# Patient Record
Sex: Female | Born: 2000 | Race: White | Hispanic: No | Marital: Single | Smoking: Never smoker
Health system: Southern US, Community
[De-identification: ages and names within clinical notes are randomized; demographics above are authoritative.]

## PROBLEM LIST (undated history)

## (undated) ENCOUNTER — Emergency Department (HOSPITAL_COMMUNITY): Payer: Medicaid Other

## (undated) DIAGNOSIS — F909 Attention-deficit hyperactivity disorder, unspecified type: Secondary | ICD-10-CM

---
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
## 2018-01-16 ENCOUNTER — Other Ambulatory Visit: Payer: Self-pay | Admitting: Neurology

## 2018-01-16 DIAGNOSIS — R569 Unspecified convulsions: Secondary | ICD-10-CM

## 2018-01-29 ENCOUNTER — Ambulatory Visit
Admission: RE | Admit: 2018-01-29 | Discharge: 2018-01-29 | Disposition: A | Discharge status: Home / Self Care | Payer: Medicaid Other | Source: Ambulatory Visit | Attending: Neurology | Admitting: Neurology

## 2018-01-29 DIAGNOSIS — R569 Unspecified convulsions: Secondary | ICD-10-CM | POA: Diagnosis not present

## 2018-01-29 MED ORDER — GADOBENATE DIMEGLUMINE 529 MG/ML IV SOLN
15.0000 mL | Freq: Once | INTRAVENOUS | Status: AC | PRN
  Administered 2018-01-29: 14 mL via INTRAVENOUS

## 2018-02-08 DIAGNOSIS — R569 Unspecified convulsions: Secondary | ICD-10-CM | POA: Insufficient documentation

## 2018-02-19 ENCOUNTER — Ambulatory Visit: Payer: Self-pay | Admitting: Family Medicine

## 2018-02-24 ENCOUNTER — Ambulatory Visit: Payer: Self-pay | Admitting: Family Medicine

## 2018-03-19 ENCOUNTER — Ambulatory Visit: Payer: Self-pay | Admitting: Family Medicine

## 2018-03-26 ENCOUNTER — Ambulatory Visit: Payer: Self-pay | Admitting: Family Medicine

## 2018-03-31 ENCOUNTER — Ambulatory Visit: Payer: Self-pay | Admitting: Family Medicine

## 2018-04-23 ENCOUNTER — Ambulatory Visit: Payer: Self-pay | Admitting: Family Medicine

## 2018-05-08 ENCOUNTER — Ambulatory Visit: Payer: Medicaid Other | Admitting: Family Medicine

## 2018-05-08 ENCOUNTER — Other Ambulatory Visit (HOSPITAL_COMMUNITY)
Admission: RE | Admit: 2018-05-08 | Discharge: 2018-05-08 | Disposition: A | Discharge status: Home / Self Care | Payer: Medicaid Other | Source: Ambulatory Visit | Attending: Family Medicine | Admitting: Family Medicine

## 2018-05-08 ENCOUNTER — Encounter: Payer: Self-pay | Admitting: Family Medicine

## 2018-05-08 VITALS — BP 112/76 | HR 87 | Temp 98.0°F | Resp 16 | Ht 66.5 in | Wt 141.0 lb

## 2018-05-08 DIAGNOSIS — R569 Unspecified convulsions: Secondary | ICD-10-CM

## 2018-05-08 DIAGNOSIS — Z113 Encounter for screening for infections with a predominantly sexual mode of transmission: Secondary | ICD-10-CM | POA: Insufficient documentation

## 2018-05-08 DIAGNOSIS — F909 Attention-deficit hyperactivity disorder, unspecified type: Secondary | ICD-10-CM | POA: Diagnosis not present

## 2018-05-08 DIAGNOSIS — Z1159 Encounter for screening for other viral diseases: Secondary | ICD-10-CM | POA: Diagnosis not present

## 2018-05-08 DIAGNOSIS — N926 Irregular menstruation, unspecified: Secondary | ICD-10-CM

## 2018-05-08 NOTE — Progress Notes (Signed)
Name: Melanie Gordon   MRN: [id number]    DOB: May 12, 2001   Date:05/08/2018       Progress Note  Subjective  Chief Complaint  Chief Complaint  Patient presents with  . Establish Care    HPI  PT presents to establish care and for the following concerns:  Seizure-like activity: She had a history of two episodes of possible seizure like activity, Saw Dr. Sherryll BurgerShah with neurology on 01/16/2018. Had MRI and EEG and all was normal - follow up PRN. She thinks this was really a panic attack and not a seizure,   Irregular Menses: Her period has been starting more frequently - Had menses 04/14/2018 and 05/07/2018 this month, and [place] she had two menses that were slightly more frequent than her typical. Menarche at age 17 - has been very regular for several years. Has not been sexually active since October 2019.  Flow is heavy on day 2, then tapers off for about 7 days total.  She notes a lot of increased stress lately - her job ([employer]) is under new ownership and things have changed dramatically, no family stress. Discussed contraceptive options to help with regularity.  She prefers OCP, but wants to wait for right now - will call if she changes her mind.   Social Services Paperwork: Needs completed because her mother is adopting a foster child - she is looking forward to this as the child has been with her family since she was 702 weeks old (about 2 years).   ADHD: Taking 54mg  Concerta and has been doing well on this dose. Denies palpitaitons, chest pain, or shortness of breath. Her focus has been good, she is at [employer] for general studies - hoping to become a Engineer, sitemedical assistant.  Task completion is okay - she is working a lot too and this distracts her from school  Patient Active Problem List   Diagnosis Date Noted  . ADHD (attention deficit hyperactivity disorder) 05/08/2018  . Irregular menses 05/08/2018  . Seizure-like activity (HCC) 02/08/2018    History reviewed. No pertinent  surgical history.  Family History  Adopted: Yes  Problem Relation Age of Onset  . Stroke Paternal Grandmother     Social History   Socioeconomic History  . Marital status: Single    Spouse name: Not on file  . Number of children: 0  . Years of education: Not on file  . Highest education level: Some college, no degree  Occupational History  . Not on file  Social Needs  . Financial resource strain: Somewhat hard  . Food insecurity:    Worry: Never true    Inability: Never true  . Transportation needs:    Medical: No    Non-medical: No  Tobacco Use  . Smoking status: Never Smoker  . Smokeless tobacco: Never Used  Substance and Sexual Activity  . Alcohol use: No  . Drug use: No  . Sexual activity: Not Currently    Partners: Male    Birth control/protection: None  Lifestyle  . Physical activity:    Days per week: 7 days    Minutes per session: 10 min  . Stress: Not at all  Relationships  . Social connections:    Talks on phone: More than three times a week    Gets together: More than three times a week    Attends religious service: More than 4 times per year    Active member of club or organization: No  Attends meetings of clubs or organizations: Never    Relationship status: Never married  . Intimate partner violence:    Fear of current or ex partner: No    Emotionally abused: No    Physically abused: No    Forced sexual activity: No  Other Topics Concern  . Not on file  Social History Narrative  . Not on file     Current Outpatient Medications:  .  ELIDEL 1 % cream, APPLY TO UPPER EYELID/BODY  BID UNTIL IMPROVED, Disp: , Rfl: 1 .  methylphenidate 54 MG PO CR tablet, Take 1 tablet by mouth every morning., Disp: , Rfl: 0  Allergies  Allergen Reactions  . Pollen Extract Other (See Comments)    I personally reviewed active problem list, medication list, allergies, family history, social history, health maintenance, lab results with the patient/caregiver  today.   ROS  Constitutional: Negative for fever or weight change.  Respiratory: Negative for cough and shortness of breath.   Cardiovascular: Negative for chest pain or palpitations.  Gastrointestinal: Negative for abdominal pain, no bowel changes.  Musculoskeletal: Negative for gait problem or joint swelling.  Skin: Negative for rash.  Neurological: Negative for dizziness or headache.  No other specific complaints in a complete review of systems (except as listed in HPI above).  Objective  Vitals:   05/08/18 1034  BP: 112/76  Pulse: 87  Resp: 16  Temp: 98 F (36.7 C)  TempSrc: Oral  SpO2: 99%  Weight: 141 lb (64 kg)  Height: 5' 6.5" (1.689 m)   Body mass index is 22.42 kg/m.  Physical Exam  Constitutional: Patient appears well-developed and well-nourished. No distress.  HENT: Head: Normocephalic and atraumatic.  Nose: Nose normal. Mouth/Throat: Oropharynx is clear and moist. Eyes: Conjunctivae and EOM are normal. No scleral icterus.   Neck: Normal range of motion. Neck supple. No JVD present. .  Cardiovascular: Normal rate, regular rhythm and normal heart sounds.  No murmur heard. No BLE edema. Pulmonary/Chest: Effort normal and breath sounds normal. No respiratory distress. Musculoskeletal: Normal range of motion, no joint effusions. No gross deformities Neurological: Pt is alert and oriented to person, place, and time. No cranial nerve deficit. Coordination, balance, strength, speech and gait are normal.  Skin: Skin is warm and dry. No rash noted. No erythema.  Psychiatric: Patient has a normal mood and affect. behavior is normal. Judgment and thought content normal.  No results found for this or any previous visit (from the past 72 hour(s)).  PHQ2/9: Depression screen PHQ 2/9 05/08/2018  Decreased Interest 0  Down, Depressed, Hopeless 0  PHQ - 2 Score 0  Altered sleeping 0  Tired, decreased energy 0  Change in appetite 0  Feeling bad or failure about  yourself  0  Trouble concentrating 0  Moving slowly or fidgety/restless 0  Suicidal thoughts 0  PHQ-9 Score 0  Difficult doing work/chores Not difficult at all   Fall Risk: Fall Risk  05/08/2018  Falls in the past year? 1  Comment Has slipped at work due to floors being slippery  Number falls in past yr: 1  Injury with Fall? 0   Functional Status Survey: Is the patient deaf or have difficulty hearing?: No Does the patient have difficulty seeing, even when wearing glasses/contacts?: No Does the patient have difficulty concentrating, remembering, or making decisions?: No Does the patient have difficulty walking or climbing stairs?: No Does the patient have difficulty dressing or bathing?: No Does the patient have difficulty doing errands  alone such as visiting a doctor's office or shopping?: No  Assessment & Plan  1. Attention deficit hyperactivity disorder (ADHD), unspecified ADHD type - Request records from [facility]  2. Routine screening for STI (sexually transmitted infection) - HIV Antibody (routine testing w rflx) - RPR - Cervicovaginal ancillary only - Hepatitis C antibody  3. Need for hepatitis C screening test - Hepatitis C antibody  4. Seizure-like activity (HCC) - Follow up with neurology PRN  5. Irregular menses - Advised to monitor; declines OCP at this time.

## 2018-05-11 LAB — RPR: RPR Ser Ql: NONREACTIVE

## 2018-05-11 LAB — CERVICOVAGINAL ANCILLARY ONLY
CHLAMYDIA, DNA PROBE: NEGATIVE
NEISSERIA GONORRHEA: NEGATIVE

## 2018-05-11 LAB — HEPATITIS C ANTIBODY
HEP C AB: NONREACTIVE
SIGNAL TO CUT-OFF: 0.04 (ref <1.00)

## 2018-05-11 LAB — HIV ANTIBODY (ROUTINE TESTING W REFLEX): HIV 1&2 Ab, 4th Generation: NONREACTIVE

## 2018-05-15 ENCOUNTER — Encounter: Payer: Self-pay | Admitting: Family Medicine

## 2018-05-15 DIAGNOSIS — L209 Atopic dermatitis, unspecified: Secondary | ICD-10-CM | POA: Insufficient documentation

## 2018-05-18 ENCOUNTER — Encounter: Payer: Self-pay | Admitting: Family Medicine

## 2018-05-19 ENCOUNTER — Ambulatory Visit (INDEPENDENT_AMBULATORY_CARE_PROVIDER_SITE_OTHER): Payer: Medicaid Other | Admitting: Nurse Practitioner

## 2018-05-19 ENCOUNTER — Encounter: Payer: Self-pay | Admitting: Nurse Practitioner

## 2018-05-19 VITALS — BP 128/78 | HR 116 | Temp 100.2°F | Resp 18 | Ht 66.5 in | Wt 137.8 lb

## 2018-05-19 DIAGNOSIS — R0981 Nasal congestion: Secondary | ICD-10-CM

## 2018-05-19 DIAGNOSIS — R509 Fever, unspecified: Secondary | ICD-10-CM

## 2018-05-19 DIAGNOSIS — R6889 Other general symptoms and signs: Secondary | ICD-10-CM

## 2018-05-19 DIAGNOSIS — R000 Tachycardia, unspecified: Secondary | ICD-10-CM

## 2018-05-19 DIAGNOSIS — R110 Nausea: Secondary | ICD-10-CM

## 2018-05-19 MED ORDER — FLUTICASONE PROPIONATE 50 MCG/ACT NA SUSP: 2.0000 | Freq: Every day | NASAL | 6 refills | Status: DC

## 2018-05-19 MED ORDER — ONDANSETRON 4 MG PO TBDP: 4.0000 mg | ORAL_TABLET | Freq: Three times a day (TID) | ORAL | 0 refills | Status: DC | PRN

## 2018-05-19 MED ORDER — OSELTAMIVIR PHOSPHATE 75 MG PO CAPS: 75.0000 mg | ORAL_CAPSULE | Freq: Two times a day (BID) | ORAL | 0 refills | Status: DC

## 2018-05-19 NOTE — Patient Instructions (Addendum)
For Fever/Pain: Acetaminophen 500mg  every 6 hours as needed (maximum of 3000mg  a day) alternate with ibuprofen 600mg  every 6 hours (no more than 2,400mg  daily)  For coughing: try dextromethorphan for a cough suppressant, and/or a cool mist humidifier, lozenges  For sore throat: saline gargles, honey herbal tea, lozenges, throat spray  To dry out your nose: try an antihistamine like loratadine (non-sedating) or diphenhydramine (sedating) or others To relieve a stuffy nose: try an oral decongestant  Like pseudoephedrine if you are under the age of 25 and do not have high blood pressure, neti pot To make blowing your nose easier: guaifenesin    Influenza, Adult Influenza is also called "the flu." It is an infection in the lungs, nose, and throat (respiratory tract). It is caused by a virus. The flu causes symptoms that are similar to symptoms of a cold. It also causes a high fever and body aches. The flu spreads easily from person to person (is contagious). Getting a flu shot (influenza vaccination) every year is the best way to prevent the flu. What are the causes? This condition is caused by the influenza virus. You can get the virus by:  Breathing in droplets that are in the air from the cough or sneeze of a person who has the virus.  Touching something that has the virus on it (is contaminated) and then touching your mouth, nose, or eyes. What increases the risk? Certain things may make you more likely to get the flu. These include:  Not washing your hands often.  Having close contact with many people during cold and flu season.  Touching your mouth, eyes, or nose without first washing your hands.  Not getting a flu shot every year. You may have a higher risk for the flu, along with serious problems such as a lung infection (pneumonia), if you:  Are older than 65.  Are pregnant.  Have a weakened disease-fighting system (immune system) because of a disease or taking certain  medicines.  Have a long-term (chronic) illness, such as: ? Heart, kidney, or lung disease. ? Diabetes. ? Asthma.  Have a liver disorder.  Are very overweight (morbidly obese).  Have anemia. This is a condition that affects your red blood cells. What are the signs or symptoms? Symptoms usually begin suddenly and last 4-14 days. They may include:  Fever and chills.  Headaches, body aches, or muscle aches.  Sore throat.  Cough.  Runny or stuffy (congested) nose.  Chest discomfort.  Not wanting to eat as much as normal (poor appetite).  Weakness or feeling tired (fatigue).  Dizziness.  Feeling sick to your stomach (nauseous) or throwing up (vomiting). How is this treated? If the flu is found early, you can be treated with medicine that can help reduce how bad the illness is and how long it lasts (antiviral medicine). This may be given by mouth (orally) or through an IV tube. Taking care of yourself at home can help your symptoms get better. Your doctor may suggest:  Taking over-the-counter medicines.  Drinking plenty of fluids. The flu often goes away on its own. If you have very bad symptoms or other problems, you may be treated in a hospital. Follow these instructions at home:  Activity  Rest as needed. Get plenty of sleep.  Stay home from work or school as told by your doctor. ? Do not leave home until you do not have a fever for 24 hours without taking medicine. ? Leave home only to visit your  doctor. Eating and drinking  Take an ORS (oral rehydration solution). This is a drink that is sold at pharmacies and stores.  Drink enough fluid to keep your pee (urine) pale yellow.  Drink clear fluids in small amounts as you are able. Clear fluids include: ? Water. ? Ice chips. ? Fruit juice that has water added (diluted fruit juice). ? Low-calorie sports drinks.  Eat bland, easy-to-digest foods in small amounts as you are able. These foods  include: ? Bananas. ? Applesauce. ? Rice. ? Lean meats. ? Toast. ? Crackers.  Do not eat or drink: ? Fluids that have a lot of sugar or caffeine. ? Alcohol. ? Spicy or fatty foods. General instructions  Take over-the-counter and prescription medicines only as told by your doctor.  Use a cool mist humidifier to add moisture to the air in your home. This can make it easier for you to breathe.  Cover your mouth and nose when you cough or sneeze.  Wash your hands with soap and water often, especially after you cough or sneeze. If you cannot use soap and water, use alcohol-based hand sanitizer.  Keep all follow-up visits as told by your doctor. This is important. How is this prevented?   Get a flu shot every year. You may get the flu shot in late summer, fall, or winter. Ask your doctor when you should get your flu shot.  Avoid contact with people who are sick during fall and winter (cold and flu season). Contact a doctor if:  You get new symptoms.  You have: ? Chest pain. ? Watery poop (diarrhea). ? A fever.  Your cough gets worse.  You start to have more mucus.  You feel sick to your stomach.  You throw up. Get help right away if you:  Have shortness of breath.  Have trouble breathing.  Have skin or nails that turn a bluish color.  Have very bad pain or stiffness in your neck.  Get a sudden headache.  Get sudden pain in your face or ear.  Cannot eat or drink without throwing up. Summary  Influenza ("the flu") is an infection in the lungs, nose, and throat. It is caused by a virus.  Take over-the-counter and prescription medicines only as told by your doctor.  Getting a flu shot every year is the best way to avoid getting the flu. This information is not intended to replace advice given to you by your health care provider. Make sure you discuss any questions you have with your health care provider. Document Released: 02/06/2008 Document Revised:  10/15/2017 Document Reviewed: 10/15/2017 Elsevier Interactive Patient Education  2019 [employer].

## 2018-05-19 NOTE — Progress Notes (Signed)
Name: Melanie Gordon   MRN: [id number]    DOB: 2001-03-28   Date:05/19/2018       Progress Note  Subjective  Chief Complaint  Chief Complaint  Patient presents with  . Generalized Body Aches    HPI  Patient state she woke up this morning with generalized body aches, fatigue, head congestion, decreased appetite. Took ibuprofen 600mg  an hour ago- states helped with pain. States left knee hurts a lot too- had to wear a brace there before.   Denies cough, rhinorrhea, sore throat, abdominal pain, rash, dysuria, ear aches, shortness of breath- did have an episode of anxiety about sickness where she had to take some deep breaths.   No recent diet changes or activity changes, tick bites.   Patient Active Problem List   Diagnosis Date Noted  . Atopic dermatitis 05/15/2018  . ADHD (attention deficit hyperactivity disorder) 05/08/2018  . Irregular menses 05/08/2018  . Seizure-like activity (HCC) 02/08/2018    Past Medical History:  Diagnosis Date  . ADHD (attention deficit hyperactivity disorder)     History reviewed. No pertinent surgical history.  Social History   Tobacco Use  . Smoking status: Never Smoker  . Smokeless tobacco: Never Used  Substance Use Topics  . Alcohol use: No     Current Outpatient Medications:  .  ELIDEL 1 % cream, APPLY TO UPPER EYELID/BODY  BID UNTIL IMPROVED, Disp: , Rfl: 1 .  methylphenidate 54 MG PO CR tablet, Take 1 tablet by mouth every morning., Disp: , Rfl: 0 .  fluticasone (FLONASE) 50 MCG/ACT nasal spray, Place 2 sprays into both nostrils daily., Disp: 16 g, Rfl: 6 .  ondansetron (ZOFRAN-ODT) 4 MG disintegrating tablet, Take 1 tablet (4 mg total) by mouth every 8 (eight) hours as needed for nausea or vomiting., Disp: 20 tablet, Rfl: 0 .  oseltamivir (TAMIFLU) 75 MG capsule, Take 1 capsule (75 mg total) by mouth 2 (two) times daily., Disp: 10 capsule, Rfl: 0  Allergies  Allergen Reactions  . Pollen Extract Other (See Comments)    ROS   No  other specific complaints in a complete review of systems (except as listed in HPI above).  Objective  Vitals:   05/19/18 1455  BP: 128/78  Pulse: (!) 116  Resp: 18  Temp: 100.2 F (37.9 C)  TempSrc: Oral  SpO2: 98%  Weight: 137 lb 12.8 oz (62.5 kg)  Height: 5' 6.5" (1.689 m)    Body mass index is 21.91 kg/m.  Nursing Note and Vital Signs reviewed.  Physical Exam Constitutional:      General: She is not in acute distress.    Appearance: She is not diaphoretic.  HENT:     Head: Normocephalic and atraumatic.     Right Ear: Hearing, tympanic membrane, ear canal and external ear normal.     Left Ear: Hearing, tympanic membrane, ear canal and external ear normal.     Nose: Nose normal.     Right Sinus: No maxillary sinus tenderness or frontal sinus tenderness.     Left Sinus: No maxillary sinus tenderness or frontal sinus tenderness.     Mouth/Throat:     Mouth: Mucous membranes are moist.     Pharynx: Uvula midline. No oropharyngeal exudate or posterior oropharyngeal erythema.  Eyes:     General:        Right eye: No discharge.        Left eye: No discharge.     Conjunctiva/sclera: Conjunctivae normal.  Neck:  Musculoskeletal: Normal range of motion.  Cardiovascular:     Rate and Rhythm: Tachycardia present.  Pulmonary:     Effort: Pulmonary effort is normal.     Breath sounds: Normal breath sounds.  Lymphadenopathy:     Cervical: No cervical adenopathy.  Skin:    General: Skin is warm and dry.     Findings: No erythema or rash.  Neurological:     Mental Status: She is alert.  Psychiatric:        Mood and Affect: Mood normal.        Judgment: Judgment normal.      No results found for this or any previous visit (from the past 48 hour(s)).  Assessment & Plan  1. Influenza-like symptoms Despite negative flu swab, patient symptoms appear to be flu- discussed risks and benefits of starting tamiflu- will get CBC, BMP to evaluate hydration, and other causes  of tachycardia- likely due to fever. Discussed ER precautions.  - oseltamivir (TAMIFLU) 75 MG capsule; Take 1 capsule (75 mg total) by mouth 2 (two) times daily.  Dispense: 10 capsule; Refill: 0  2. Tachycardia Drink plenty of fluids, alternate between tylenol and ibuprofen.  - CBC with Differential - BASIC METABOLIC PANEL WITH GFR  3. Fever, unspecified fever cause - CBC with Differential  4. Nasal congestion - fluticasone (FLONASE) 50 MCG/ACT nasal spray; Place 2 sprays into both nostrils daily.  Dispense: 16 g; Refill: 6  5. Nausea - ondansetron (ZOFRAN-ODT) 4 MG disintegrating tablet; Take 1 tablet (4 mg total) by mouth every 8 (eight) hours as needed for nausea or vomiting.  Dispense: 20 tablet; Refill: 0

## 2018-05-20 LAB — CBC WITH DIFFERENTIAL/PLATELET
Absolute Monocytes: 548 cells/uL (ref 200–950)
BASOS PCT: 0.2 %
Basophils Absolute: 17 cells/uL (ref 0–200)
Eosinophils Absolute: 183 cells/uL (ref 15–500)
Eosinophils Relative: 2.1 %
HCT: 38.4 % (ref 35.0–45.0)
HEMOGLOBIN: 12.9 g/dL (ref 11.7–15.5)
Lymphs Abs: 687 cells/uL — ABNORMAL LOW (ref 850–3900)
MCH: 26.1 pg — ABNORMAL LOW (ref 27.0–33.0)
MCHC: 33.6 g/dL (ref 32.0–36.0)
MCV: 77.6 fL — ABNORMAL LOW (ref 80.0–100.0)
MONOS PCT: 6.3 %
MPV: 10.6 fL (ref 7.5–12.5)
Neutro Abs: 7265 cells/uL (ref 1500–7800)
Neutrophils Relative %: 83.5 %
PLATELETS: 239 10*3/uL (ref 140–400)
RBC: 4.95 10*6/uL (ref 3.80–5.10)
RDW: 13.7 % (ref 11.0–15.0)
TOTAL LYMPHOCYTE: 7.9 %
WBC: 8.7 10*3/uL (ref 3.8–10.8)

## 2018-05-20 LAB — BASIC METABOLIC PANEL WITH GFR
BUN: 10 mg/dL (ref 7–20)
CALCIUM: 9.1 mg/dL (ref 8.9–10.4)
CHLORIDE: 105 mmol/L (ref 98–110)
CO2: 21 mmol/L (ref 20–32)
Creat: 0.72 mg/dL (ref 0.50–1.00)
GFR, Est African American: 141 mL/min/{1.73_m2} (ref >=60)
GFR, Est Non African American: 121 mL/min/{1.73_m2} (ref >=60)
Glucose, Bld: 79 mg/dL (ref 65–99)
POTASSIUM: 3.7 mmol/L — AB (ref 3.8–5.1)
SODIUM: 138 mmol/L (ref 135–146)

## 2018-06-01 ENCOUNTER — Telehealth: Payer: Self-pay | Admitting: Family Medicine

## 2018-06-01 DIAGNOSIS — F909 Attention-deficit hyperactivity disorder, unspecified type: Secondary | ICD-10-CM

## 2018-06-01 MED ORDER — METHYLPHENIDATE HCL ER (OSM) 54 MG PO TBCR: 54.0000 mg | EXTENDED_RELEASE_TABLET | ORAL | 0 refills | Status: DC

## 2018-06-01 NOTE — Telephone Encounter (Signed)
DO you refill Concerta for this patient. Last time on our chart was August

## 2018-06-01 NOTE — Telephone Encounter (Signed)
Yes - I am taking over rx.  From [facility].  I can provide 30 day supply, then she needs to come in for follow up so that we can get her on an every 3 month follow up routine.

## 2018-06-01 NOTE — Telephone Encounter (Signed)
Copied from CRM [phone]. Topic: Quick Communication - See Telephone Encounter >> Jun 01, 2018  1:23 PM Herby AbrahamJohnson, Shiquita C wrote: CRM for notification. See Telephone encounter for: 06/01/18.  Mom called in requesting a refill for pt's Concerta. Mom says that pt recently established with [facility] from her Pediatrician. Pt has never had Rx filled with [facility] as of yet. Mom provided the details on the bottle.   Concerta 54 MG - directions, to take 1 tablet a day every morning.   [id number] [address] [facility], [place] - [address] [facility] [address] [phone] (Phone) [phone] (Fax)

## 2018-06-03 NOTE — Telephone Encounter (Signed)
Left message on patient voice mail to make appointment before medication give out for first 30 days

## 2018-07-01 ENCOUNTER — Ambulatory Visit: Payer: Medicaid Other | Admitting: Family Medicine

## 2018-07-07 ENCOUNTER — Encounter: Payer: Self-pay | Admitting: Family Medicine

## 2018-07-07 ENCOUNTER — Ambulatory Visit (INDEPENDENT_AMBULATORY_CARE_PROVIDER_SITE_OTHER): Payer: Medicaid Other | Admitting: Family Medicine

## 2018-07-07 ENCOUNTER — Other Ambulatory Visit: Payer: Self-pay

## 2018-07-07 DIAGNOSIS — F909 Attention-deficit hyperactivity disorder, unspecified type: Secondary | ICD-10-CM

## 2018-07-07 MED ORDER — METHYLPHENIDATE HCL ER (OSM) 54 MG PO TBCR: 54.0000 mg | EXTENDED_RELEASE_TABLET | ORAL | 0 refills | Status: DC

## 2018-07-07 NOTE — Progress Notes (Signed)
Established Patient Office Visit  Subjective:  Patient ID: Melanie Gordon, female    DOB: Aug 10, 2000  Age: 18 y.o. MRN: [id number]  CC:  Chief Complaint  Patient presents with  . Follow-up  . Medication Refill    HPI Melanie Gordon presents for Concerta refill.  She has been out for 24 hours and has noticed an increased in hyperactivity and distractability.  She has been on 54mg  for several years (since middle or early high school).  She has no palpitations, chest pain, shortness of breath. 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  Past Medical History:  Diagnosis Date  . ADHD (attention deficit hyperactivity disorder)     No past surgical history on file.  Family History  Adopted: Yes  Problem Relation Age of Onset  . Stroke Paternal Grandmother     Social History   Socioeconomic History  . Marital status: Single    Spouse name: Not on file  . Number of children: 0  . Years of education: Not on file  . Highest education level: Some college, no degree  Occupational History  . Not on file  Social Needs  . Financial resource strain: Somewhat hard  . Food insecurity:    Worry: Never true    Inability: Never true  . Transportation needs:    Medical: No    Non-medical: No  Tobacco Use  . Smoking status: Never Smoker  . Smokeless tobacco: Never Used  Substance and Sexual Activity  . Alcohol use: No  . Drug use: No  . Sexual activity: Not Currently    Partners: Male    Birth control/protection: None  Lifestyle  . Physical activity:    Days per week: 7 days    Minutes per session: 10 min  . Stress: Not at all  Relationships  . Social connections:    Talks on phone: More than three times a week    Gets together: More than three times a week    Attends religious service: More than 4 times per year    Active member of club or organization: No    Attends meetings of clubs or organizations:  Never    Relationship status: Never married  . Intimate partner violence:    Fear of current or ex partner: No    Emotionally abused: No    Physically abused: No    Forced sexual activity: No  Other Topics Concern  . Not on file  Social History Narrative  . Not on file    Outpatient Medications Prior to Visit  Medication Sig Dispense Refill  . ELIDEL 1 % cream APPLY TO UPPER EYELID/BODY  BID UNTIL IMPROVED  1  . fluticasone (FLONASE) 50 MCG/ACT nasal spray Place 2 sprays into both nostrils daily. 16 g 6  . ondansetron (ZOFRAN-ODT) 4 MG disintegrating tablet Take 1 tablet (4 mg total) by mouth every 8 (eight) hours as needed for nausea or vomiting. 20 tablet 0  . methylphenidate 54 MG PO CR tablet Take 1 tablet (54 mg total) by mouth every morning. 30 tablet 0  . oseltamivir (TAMIFLU) 75 MG capsule Take 1 capsule (75 mg total) by mouth 2 (two) times daily. (Patient not taking: Reported on 07/07/2018) 10 capsule 0   No facility-administered medications prior to visit.     Allergies  Allergen Reactions  . Pollen Extract Other (See Comments)    ROS  Review of Systems  Constitutional: Negative.  Negative for chills, fatigue, fever and unexpected weight change.  HENT: Negative.  Negative for congestion and sore throat.   Respiratory: Negative for cough and shortness of breath.   Cardiovascular: Negative for chest pain, palpitations and leg swelling.  Gastrointestinal: Negative for abdominal pain, constipation, diarrhea, nausea and vomiting.  Endocrine: Negative for polydipsia, polyphagia and polyuria.  Genitourinary: Negative.   Musculoskeletal: Negative.   Skin: Negative.   Neurological: Negative for light-headedness and headaches.  Hematological: Negative.   Psychiatric/Behavioral: Negative.   All other systems reviewed and are negative.    Objective:    Physical Exam  Constitutional: She is oriented to person, place, and time. She appears well-developed and well-nourished.  No distress.  HENT:  Head: Normocephalic and atraumatic.  Right Ear: External ear normal.  Left Ear: External ear normal.  Nose: Nose normal.  Mouth/Throat: Oropharynx is clear and moist. No oropharyngeal exudate.  Eyes: Pupils are equal, round, and reactive to light. Conjunctivae and EOM are normal.  Neck: Normal range of motion. Neck supple. No JVD present. No thyromegaly present.  Cardiovascular: Normal rate and regular rhythm. Exam reveals no gallop and no friction rub.  No murmur heard. Pulmonary/Chest: Breath sounds normal. She has no wheezes. She has no rales. She exhibits no tenderness.  Musculoskeletal: Normal range of motion.        General: No tenderness or edema.  Lymphadenopathy:    She has no cervical adenopathy.  Neurological: She is alert and oriented to person, place, and time. No cranial nerve deficit.  Skin: Skin is warm and dry. No rash noted.  Psychiatric: She has a normal mood and affect. Her behavior is normal. Judgment and thought content normal.  Nursing note and vitals reviewed.   BP 124/76 (BP Location: Right Arm, Patient Position: Sitting, Cuff Size: Large)   Pulse 82   Temp (!) 97.4 F (36.3 C) (Oral)   Resp 16   Ht 5\' 7"  (1.702 m)   Wt 133 lb 6.4 oz (60.5 kg)   SpO2 98%   BMI 20.89 kg/m  Wt Readings from Last 3 Encounters:  07/07/18 133 lb 6.4 oz (60.5 kg) (60 %, Z= 0.26)*  05/19/18 137 lb 12.8 oz (62.5 kg) (68 %, Z= 0.46)*  05/08/18 141 lb (64 kg) (72 %, Z= 0.58)*   * Growth percentiles are based on CDC (Girls, 2-20 Years) data.    Health Maintenance Due  Topic Date Due  . TETANUS/TDAP  01/07/2018    There are no preventive care reminders to display for this patient.  No results found for: TSH Lab Results  Component Value Date   WBC 8.7 05/19/2018   HGB 12.9 05/19/2018   HCT 38.4 05/19/2018   MCV 77.6 (L) 05/19/2018   PLT 239 05/19/2018   Lab Results  Component Value Date   NA 138 05/19/2018   K 3.7 (L) 05/19/2018   CO2 21  05/19/2018   GLUCOSE 79 05/19/2018   BUN 10 05/19/2018   CREATININE 0.72 05/19/2018   BILITOT 0.4 11/04/2017   ALKPHOS 63 11/04/2017   AST 23 11/04/2017   ALT 17 11/04/2017   PROT 7.6 11/04/2017   ALBUMIN 4.5 11/04/2017   CALCIUM 9.1 05/19/2018   ANIONGAP 11 11/04/2017   No results found for: CHOL No results found for: HDL No results found for: LDLCALC No results found for: TRIG No results found for: CHOLHDL No results found for: IYME1R    Assessment & Plan:  Problem List Items Addressed This Visit      Other   ADHD (attention deficit hyperactivity disorder)   Relevant Medications   methylphenidate 54 MG PO CR tablet (Start on 08/06/2018)   methylphenidate 54 MG PO CR tablet   methylphenidate 54 MG PO CR tablet (Start on 09/06/2018)      Meds ordered this encounter  Medications  . methylphenidate 54 MG PO CR tablet    Sig: Take 1 tablet (54 mg total) by mouth every morning for 30 days.    Dispense:  30 tablet    Refill:  0    Order Specific Question:   Supervising Provider    Answer:   Alba Cory [[id number]]  . methylphenidate 54 MG PO CR tablet    Sig: Take 1 tablet (54 mg total) by mouth every morning for 30 days.    Dispense:  30 tablet    Refill:  0    Order Specific Question:   Supervising Provider    Answer:   Alba Cory [[id number]]  . methylphenidate 54 MG PO CR tablet    Sig: Take 1 tablet (54 mg total) by mouth every morning for 30 days.    Dispense:  30 tablet    Refill:  0    Order Specific Question:   Supervising Provider    Answer:   Alba Cory [[id number]]    Follow-up: Return in about 3 months (around 10/05/2018).    Doren Custard, FNP

## 2018-08-07 ENCOUNTER — Encounter: Payer: Medicaid Other | Admitting: Family Medicine

## 2018-10-02 ENCOUNTER — Ambulatory Visit (INDEPENDENT_AMBULATORY_CARE_PROVIDER_SITE_OTHER): Payer: Medicaid Other | Admitting: Family Medicine

## 2018-10-02 ENCOUNTER — Other Ambulatory Visit: Payer: Self-pay

## 2018-10-02 ENCOUNTER — Encounter: Payer: Self-pay | Admitting: Family Medicine

## 2018-10-02 DIAGNOSIS — F909 Attention-deficit hyperactivity disorder, unspecified type: Secondary | ICD-10-CM

## 2018-10-02 MED ORDER — METHYLPHENIDATE HCL ER (OSM) 54 MG PO TBCR: 54.0000 mg | EXTENDED_RELEASE_TABLET | ORAL | 0 refills | Status: DC

## 2018-10-02 NOTE — Progress Notes (Signed)
Name: Melanie Gordon Drab   MRN: [id number]    DOB: [birth year]   Date:10/02/2018       Progress Note  Subjective  Chief Complaint  Chief Complaint  Patient presents with  . Medication Refill    I connected with  Melanie Gordon Tesfaye  on 10/02/18 at  7:40 AM EDT by a video enabled telemedicine application and verified that I am speaking with the correct person using two identifiers.  I discussed the limitations of evaluation and management by telemedicine and the availability of in person appointments. The patient expressed understanding and agreed to proceed. Staff also discussed with the patient that there may be a patient responsible charge related to this service. Patient Location: Home Provider Location: Office Additional Individuals present: None  HPI  Melanie Gordon Casselman presents for Concerta refill.  She has been on 54mg  for several years (since middle or early high school).  The medication is lasting the entire day without issues.   She has no palpitations, chest pain, shortness of breath, no sleeping issues, appetite is adequate. 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  Patient Active Problem List   Diagnosis Date Noted  . Atopic dermatitis 05/15/2018  . ADHD (attention deficit hyperactivity disorder) 05/08/2018  . Irregular menses 05/08/2018  . Seizure-like activity (HCC) 02/08/2018    History reviewed. No pertinent surgical history.  Family History  Adopted: Yes  Problem Relation Age of Onset  . Stroke Paternal Grandmother     Social History   Socioeconomic History  . Marital status: Single    Spouse name: Not on file  . Number of children: 0  . Years of education: Not on file  . Highest education level: Some college, no degree  Occupational History  . Not on file  Social Needs  . Financial resource strain:  Somewhat hard  . Food insecurity:    Worry: Never true    Inability: Never true  . Transportation needs:    Medical: No    Non-medical: No  Tobacco Use  . Smoking status: Never Smoker  . Smokeless tobacco: Never Used  Substance and Sexual Activity  . Alcohol use: No  . Drug use: No  . Sexual activity: Not Currently    Partners: Male    Birth control/protection: None  Lifestyle  . Physical activity:    Days per week: 7 days    Minutes per session: 10 min  . Stress: Not at all  Relationships  . Social connections:    Talks on phone: More than three times a week    Gets together: More than three times a week    Attends religious service: More than 4 times per year    Active member of club or organization: No    Attends meetings of clubs or organizations: Never    Relationship status: Never married  . Intimate partner violence:    Fear of current or ex partner: No    Emotionally abused: No    Physically abused: No    Forced sexual activity: No  Other Topics Concern  . Not on file  Social History Narrative  . Not on file     Current Outpatient Medications:  .  ELIDEL 1 % cream, APPLY TO UPPER EYELID/BODY  BID UNTIL IMPROVED, Disp: ,  Rfl: 1 .  fluticasone (FLONASE) 50 MCG/ACT nasal spray, Place 2 sprays into both nostrils daily., Disp: 16 g, Rfl: 6 .  methylphenidate 54 MG PO CR tablet, Take 1 tablet (54 mg total) by mouth every morning for 30 days., Disp: 30 tablet, Rfl: 0 .  methylphenidate 54 MG PO CR tablet, Take 1 tablet (54 mg total) by mouth every morning for 30 days., Disp: 30 tablet, Rfl: 0 .  methylphenidate 54 MG PO CR tablet, Take 1 tablet (54 mg total) by mouth every morning for 30 days., Disp: 30 tablet, Rfl: 0 .  ondansetron (ZOFRAN-ODT) 4 MG disintegrating tablet, Take 1 tablet (4 mg total) by mouth every 8 (eight) hours as needed for nausea or vomiting. (Patient not taking: Reported on 10/02/2018), Disp: 20 tablet, Rfl: 0  Allergies  Allergen Reactions  .  Pollen Extract Other (See Comments)    I personally reviewed active problem list, medication list, allergies, notes from last encounter with the patient/caregiver today.   ROS  Constitutional: Negative for fever or weight change.  Respiratory: Negative for cough and shortness of breath.   Cardiovascular: Negative for chest pain or palpitations.  Gastrointestinal: Negative for abdominal pain, no bowel changes.  Musculoskeletal: Negative for gait problem or joint swelling.  Skin: Negative for rash.  Neurological: Negative for dizziness or headache.  No other specific complaints in a complete review of systems (except as listed in HPI above).  Objective  Virtual encounter, vitals not obtained.  There is no height or weight on file to calculate BMI.  Physical Exam  Constitutional: Patient appears well-developed and well-nourished. No distress.  HENT: Head: Normocephalic and atraumatic.  Neck: Normal range of motion. Pulmonary/Chest: Effort normal. No respiratory distress. Speaking in complete sentences Neurological: Pt is alert and oriented to person, place, and time. Coordination, speech and gait are normal.  Psychiatric: Patient has a normal mood and affect. behavior is normal. Judgment and thought content normal.  No results found for this or any previous visit (from the past 72 hour(s)).  PHQ2/9: Depression screen [facility] 2/9 10/02/2018 07/07/2018 05/08/2018  Decreased Interest 0 0 0  Down, Depressed, Hopeless 0 0 0  PHQ - 2 Score 0 0 0  Altered sleeping 0 0 0  Tired, decreased energy 0 0 0  Change in appetite 0 0 0  Feeling bad or failure about yourself  0 0 0  Trouble concentrating 0 0 0  Moving slowly or fidgety/restless 0 0 0  Suicidal thoughts 0 0 0  PHQ-9 Score 0 0 0  Difficult doing work/chores Not difficult at all Not difficult at all Not difficult at all   PHQ-2/9 Result is negative.    Fall Risk: Fall Risk  10/02/2018 07/07/2018 05/19/2018 05/08/2018  Falls in the  past year? 0 0 1 1  Comment - - Has slipped at work due to floors being slippery Has slipped at work due to floors being slippery  Number falls in past yr: 0 0 1 1  Injury with Fall? 0 0 1 0  Follow up - Falls evaluation completed - -    Assessment & Plan  1. Attention deficit hyperactivity disorder (ADHD), unspecified ADHD type - doing well on current medication and dosing, continue course of treatment at this time. - methylphenidate 54 MG PO CR tablet; Take 1 tablet (54 mg total) by mouth every morning for 30 days.  Dispense: 30 tablet; Refill: 0 - methylphenidate 54 MG PO CR tablet; Take 1 tablet (54 mg total)  by mouth every morning for 30 days.  Dispense: 30 tablet; Refill: 0 - methylphenidate 54 MG PO CR tablet; Take 1 tablet (54 mg total) by mouth every morning for 30 days.  Dispense: 30 tablet; Refill: 0  I discussed the assessment and treatment plan with the patient. The patient was provided an opportunity to ask questions and all were answered. The patient agreed with the plan and demonstrated an understanding of the instructions.  The patient was advised to call back or seek an in-person evaluation if the symptoms worsen or if the condition fails to improve as anticipated.  I provided 15 minutes of non-face-to-face time during this encounter.

## 2018-10-20 ENCOUNTER — Encounter: Payer: Medicaid Other | Admitting: Family Medicine

## 2019-01-01 ENCOUNTER — Encounter: Payer: Self-pay | Admitting: Family Medicine

## 2019-01-01 ENCOUNTER — Other Ambulatory Visit: Payer: Self-pay

## 2019-01-01 ENCOUNTER — Ambulatory Visit (INDEPENDENT_AMBULATORY_CARE_PROVIDER_SITE_OTHER): Payer: Medicaid Other | Admitting: Family Medicine

## 2019-01-01 VITALS — BP 122/80 | HR 62 | Temp 97.9°F | Resp 16 | Ht 67.0 in | Wt 141.1 lb

## 2019-01-01 DIAGNOSIS — F909 Attention-deficit hyperactivity disorder, unspecified type: Secondary | ICD-10-CM | POA: Diagnosis not present

## 2019-01-01 MED ORDER — METHYLPHENIDATE HCL ER (OSM) 54 MG PO TBCR: 54.0000 mg | EXTENDED_RELEASE_TABLET | ORAL | 0 refills | Status: DC

## 2019-01-01 NOTE — Progress Notes (Signed)
Name: Melanie Gordon   MRN: [id number]    DOB: 09/10/2000   Date:01/01/2019       Progress Note  Subjective  Chief Complaint  Chief Complaint  Patient presents with  . Medication Refill    HPI  Melanie Gordon for Concerta refill. She has been on 54mg  for several years (since middle or early high school).  The medication is lasting the entire day without issues.  She has no palpitations, chest pain, shortness of breath, no sleeping issues, appetite is adequate, weight is stable. 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  Patient Active Problem List   Diagnosis Date Noted  . Atopic dermatitis 05/15/2018  . ADHD (attention deficit hyperactivity disorder) 05/08/2018  . Irregular menses 05/08/2018  . Seizure-like activity (HCC) 02/08/2018    No past surgical history on file.  Family History  Adopted: Yes  Problem Relation Age of Onset  . Stroke Paternal Grandmother     Social History   Socioeconomic History  . Marital status: Single    Spouse name: Not on file  . Number of children: 0  . Years of education: Not on file  . Highest education level: Some college, no degree  Occupational History  . Not on file  Social Needs  . Financial resource strain: Somewhat hard  . Food insecurity    Worry: Never true    Inability: Never true  . Transportation needs    Medical: No    Non-medical: No  Tobacco Use  . Smoking status: Never Smoker  . Smokeless tobacco: Never Used  Substance and Sexual Activity  . Alcohol use: No  . Drug use: No  . Sexual activity: Not Currently    Partners: Male    Birth control/protection: None  Lifestyle  . Physical activity    Days per week: 7 days    Minutes per session: 10 min  . Stress: Not at all  Relationships  . Social connections    Talks on phone: More than three times a week    Gets together: More than three times a week   Attends religious service: More than 4 times per year    Active member of club or organization: No    Attends meetings of clubs or organizations: Never    Relationship status: Never married  . Intimate partner violence    Fear of current or ex partner: No    Emotionally abused: No    Physically abused: No    Forced sexual activity: No  Other Topics Concern  . Not on file  Social History Narrative  . Not on file     Current Outpatient Medications:  .  ELIDEL 1 % cream, APPLY TO UPPER EYELID/BODY  BID UNTIL IMPROVED, Disp: , Rfl: 1 .  fluticasone (FLONASE) 50 MCG/ACT nasal spray, Place 2 sprays into both nostrils daily., Disp: 16 g, Rfl: 6 .  methylphenidate 54 MG PO CR tablet, Take 1 tablet (54 mg total) by mouth every morning for 30 days., Disp: 30 tablet, Rfl: 0 .  methylphenidate 54 MG PO CR tablet, Take 1 tablet (54 mg total) by mouth every morning for 30 days., Disp: 30 tablet, Rfl: 0 .  methylphenidate 54 MG PO CR tablet, Take 1 tablet (54 mg total) by mouth every morning for 30 days., Disp: 30 tablet, Rfl: 0  Allergies  Allergen Reactions  .  Pollen Extract Other (See Comments)    I personally reviewed active problem list, medication list, allergies, notes from last encounter with the patient/caregiver today.   ROS  Constitutional: Negative for fever or weight change.  Respiratory: Negative for cough and shortness of breath.   Cardiovascular: Negative for chest pain or palpitations.  Gastrointestinal: Negative for abdominal pain, no bowel changes.  Musculoskeletal: Negative for gait problem or joint swelling.  Skin: Negative for rash.  Neurological: Negative for dizziness or headache.  No other specific complaints in a complete review of systems (except as listed in HPI above).  Objective  Vitals:   01/01/19 0828  BP: 122/80  Pulse: 62  Resp: 16  Temp: 97.9 F (36.6 C)  TempSrc: Oral  SpO2: 96%  Weight: 141 lb 1.6 oz (64 kg)  Height: 5\' 7"  (1.702 m)      Body mass index is 22.1 kg/m.  Physical Exam  Constitutional: Patient appears well-developed and well-nourished. No distress.  HENT: Head: Normocephalic and atraumatic. Eyes: Conjunctivae and EOM are normal. No scleral icterus. Neck: Normal range of motion. Neck supple. No JVD present. No thyromegaly present.  Cardiovascular: Normal rate, regular rhythm and normal heart sounds.  No murmur heard. No BLE edema. Pulmonary/Chest: Effort normal and breath sounds normal. No respiratory distress. Musculoskeletal: Normal range of motion, no joint effusions. No gross deformities Neurological: Pt is alert and oriented to person, place, and time. No cranial nerve deficit. Coordination, balance, strength, speech and gait are normal.  Skin: Skin is warm and dry. No rash noted. No erythema.  Psychiatric: Patient has a normal mood and affect. behavior is normal. Judgment and thought content normal.  No results found for this or any previous visit (from the past 72 hour(s)).  PHQ2/9: Depression screen [facility] 2/9 01/01/2019 10/02/2018 07/07/2018 05/08/2018  Decreased Interest 0 0 0 0  Down, Depressed, Hopeless 0 0 0 0  PHQ - 2 Score 0 0 0 0  Altered sleeping 0 0 0 0  Tired, decreased energy 0 0 0 0  Change in appetite 0 0 0 0  Feeling bad or failure about yourself  0 0 0 0  Trouble concentrating 0 0 0 0  Moving slowly or fidgety/restless 0 0 0 0  Suicidal thoughts 0 0 0 0  PHQ-9 Score 0 0 0 0  Difficult doing work/chores Not difficult at all Not difficult at all Not difficult at all Not difficult at all   PHQ-2/9 Result is negative.    Fall Risk: Fall Risk  01/01/2019 10/02/2018 07/07/2018 05/19/2018 05/08/2018  Falls in the past year? 0 0 0 1 1  Comment - - - Has slipped at work due to floors being slippery Has slipped at work due to floors being slippery  Number falls in past yr: 0 0 0 1 1  Injury with Fall? 0 0 0 1 0  Follow up Falls evaluation completed - Falls evaluation completed - -     Assessment & Plan   1. Attention deficit hyperactivity disorder (ADHD), unspecified ADHD type - Doing well, stable course at this time.  - methylphenidate 54 MG PO CR tablet; Take 1 tablet (54 mg total) by mouth every morning.  Dispense: 30 tablet; Refill: 0 - methylphenidate 54 MG PO CR tablet; Take 1 tablet (54 mg total) by mouth every morning.  Dispense: 30 tablet; Refill: 0 - methylphenidate 54 MG PO CR tablet; Take 1 tablet (54 mg total) by mouth every morning.  Dispense: 30 tablet; Refill: 0

## 2019-02-04 IMAGING — MR MR HEAD WO/W CM
11 series · 48 of 48 positions shown · IV contrast (multihance)
Comparison: Head CT 11/05/2017

CLINICAL DATA: Recent seizure like activity.

EXAM:
MRI HEAD WITHOUT AND WITH CONTRAST
TECHNIQUE: Multiplanar, multiecho pulse sequences of the brain and surrounding
structures were obtained without and with intravenous contrast.
CONTRAST:  14mL MULTIHANCE GADOBENATE DIMEGLUMINE 529 MG/ML IV SOLN

[Series 2: T1 · sagittal · 5.0mm · 0.45mm/px · 1 of 25 slices shown (1 of 2)]
[im 1/25]
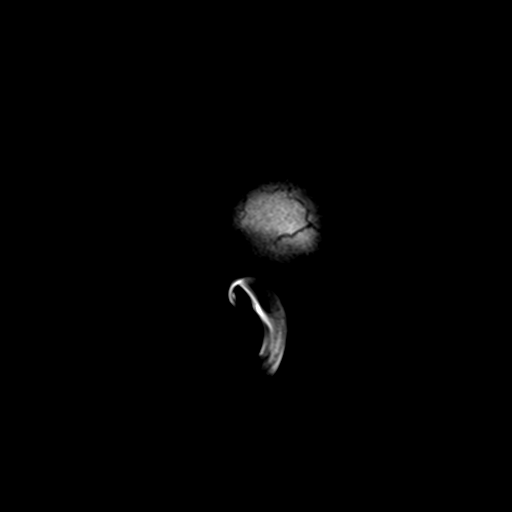

[Series 4: DWI · axial · 3.0mm · 1.20mm/px · z∈[-82,+80]mm · 4 of 55 slices shown (1 of 2)]
[im 1/55]
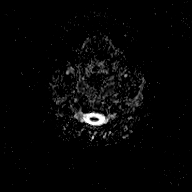
[im 19/55]
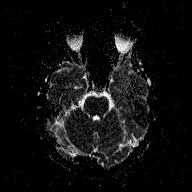
[im 37/55]
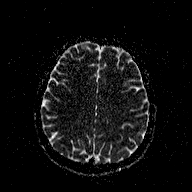
[im 55/55]
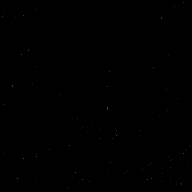

[Series 5: T2 · axial · 5.0mm · 0.72mm/px · z∈[-78,+76]mm · 2 of 23 slices shown (1 of 4)]
[im 1/23]
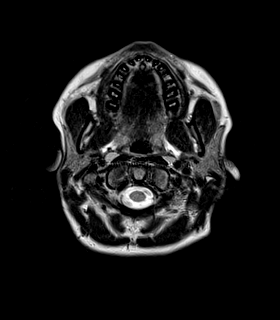
[im 23/23]
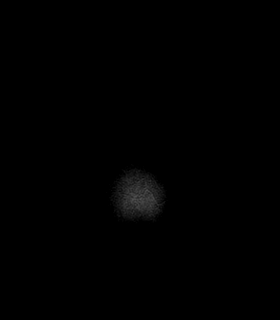

[Series 6: FLAIR · axial · 3.0mm · 0.45mm/px · z∈[-82,+80]mm · 4 of 55 slices shown]
[im 1/55]
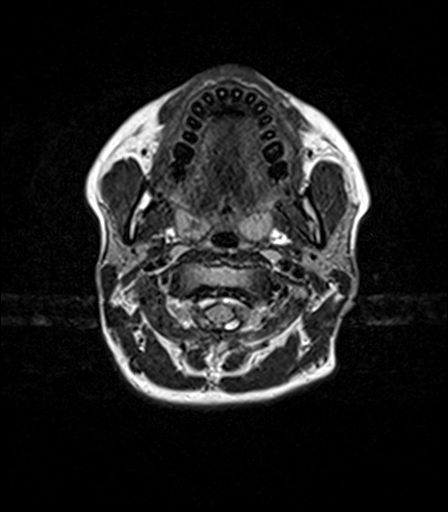
[im 19/55]
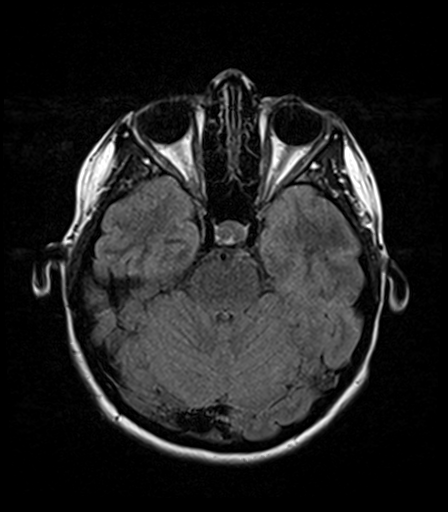
[im 37/55]
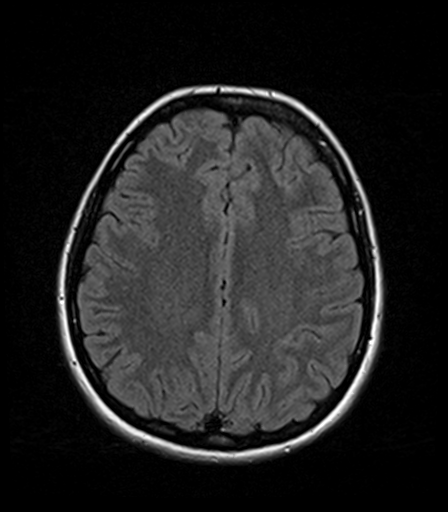
[im 55/55]
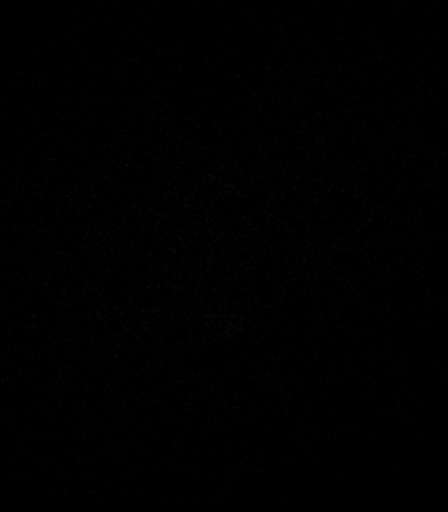

[Series 7: T2 · axial · 5.0mm · 0.72mm/px · z∈[-78,+76]mm · 2 of 23 slices shown (2 of 4)]
[im 1/23]
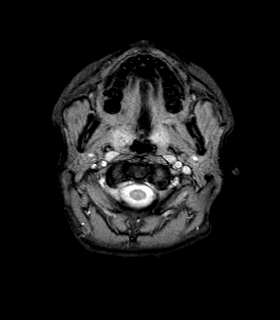
[im 23/23]
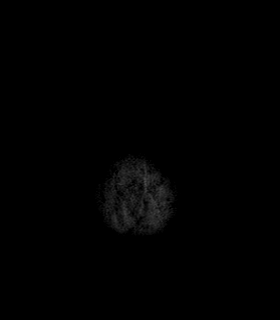

[Series 8: T1 · axial · 1.0mm · 1.00mm/px · z∈[-81,+78]mm · 12 of 160 slices shown (2 of 2)]
[im 1/160]
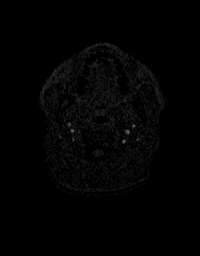
[im 15/160]
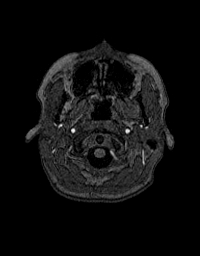
[im 29/160]
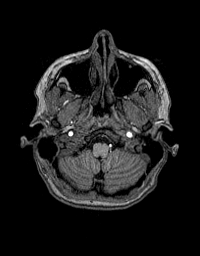
[im 44/160]
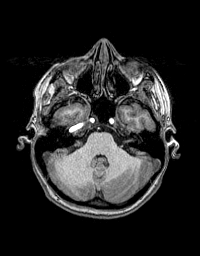
[im 58/160]
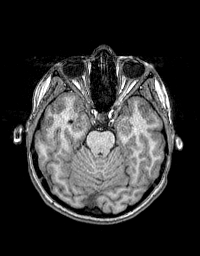
[im 73/160]
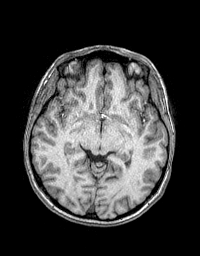
[im 87/160]
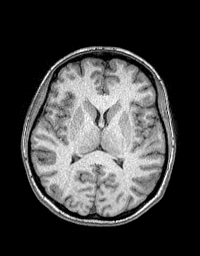
[im 102/160]
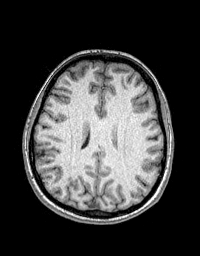
[im 116/160]
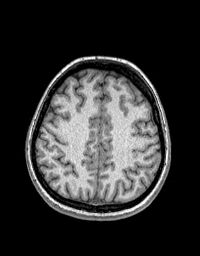
[im 131/160]
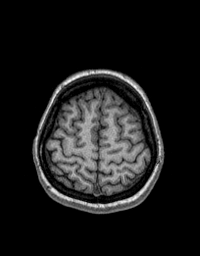
[im 145/160]
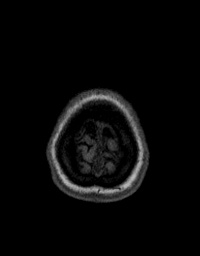
[im 160/160]
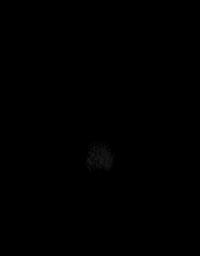

[Series 9: T2 · coronal · 3.0mm · 0.47mm/px · 3 of 34 slices shown (3 of 4)]
[im 1/34]
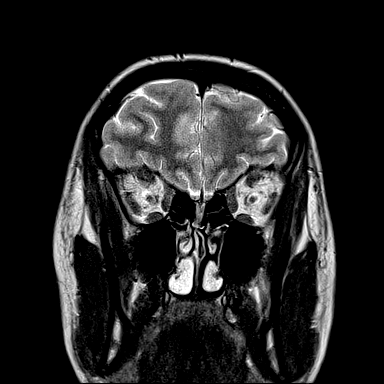
[im 17/34]
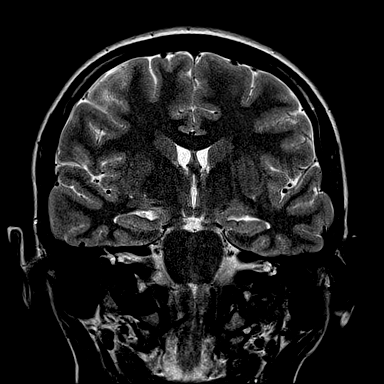
[im 34/34]
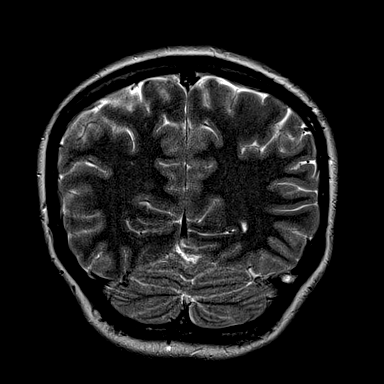

[Series 11: T1 post-contrast · coronal · 5.0mm · 0.43mm/px · 2 of 28 slices shown (1 of 2)]
[im 1/28]
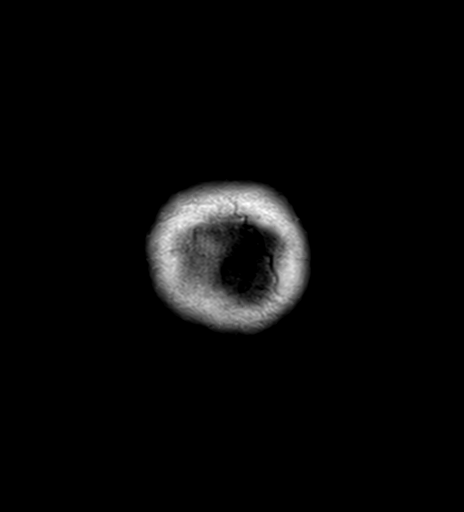
[im 28/28]
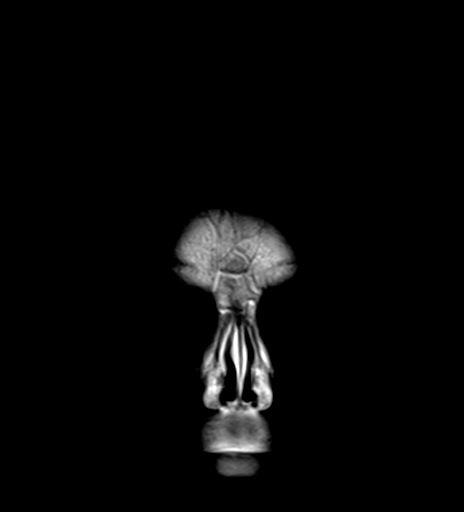

[Series 12: T1 post-contrast · axial · 1.0mm · 1.00mm/px · z∈[-81,+78]mm · 12 of 160 slices shown (2 of 2)]
[im 1/160]
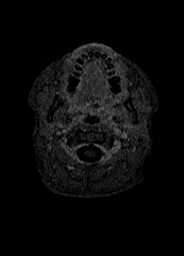
[im 15/160]
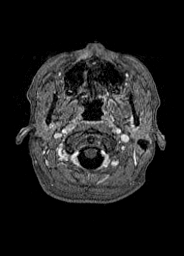
[im 29/160]
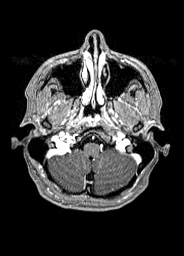
[im 44/160]
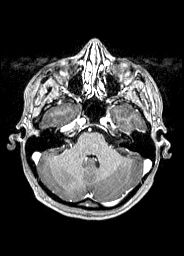
[im 58/160]
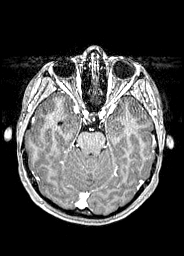
[im 73/160]
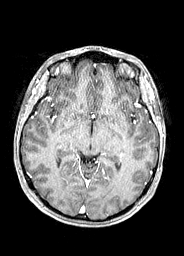
[im 87/160]
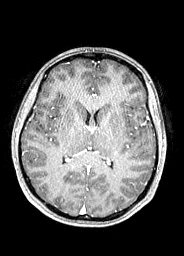
[im 102/160]
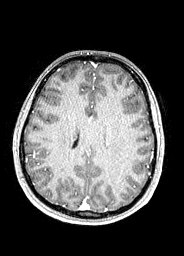
[im 116/160]
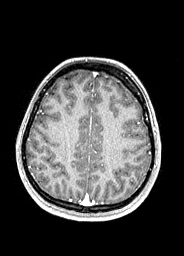
[im 131/160]
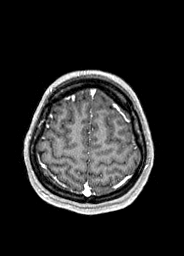
[im 145/160]
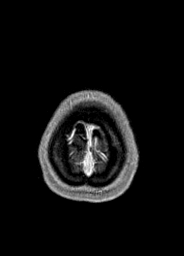
[im 160/160]
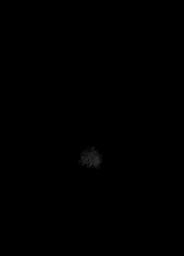

[Series 13: T2 · coronal · 5.0mm · 0.90mm/px · 2 of 28 slices shown (4 of 4)]
[im 1/28]
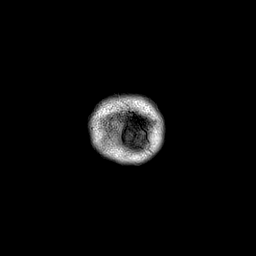
[im 28/28]
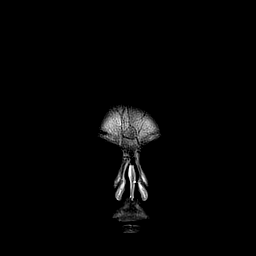

[Series 100: DWI · axial · 3.0mm · 1.20mm/px · z∈[-82,+80]mm · 4 of 55 slices shown (2 of 2)]
[im 1/55]
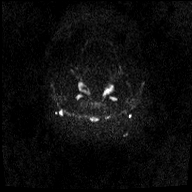
[im 19/55]
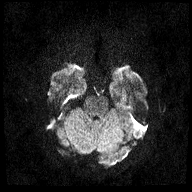
[im 37/55]
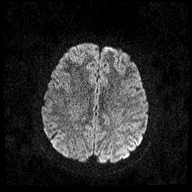
[im 55/55]
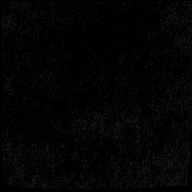

[48 of 48 positions shown; findings below may reference images not displayed]

FINDINGS: BRAIN: There is no acute infarct, acute hemorrhage or mass effect.
The midline structures are normal. There are no old infarcts. The
white matter signal is normal for the patient's age. The CSF spaces
are normal for age, with no hydrocephalus. Susceptibility-sensitive
sequences show no chronic microhemorrhage or superficial siderosis.
The hippocampi are normal and symmetric in size and signal. The
hypothalamus and mamillary bodies are normal. There is no cortical
ectopia or dysplasia. No abnormal contrast enhancement.

VASCULAR: Major intracranial arterial and venous sinus flow voids
are preserved.

SKULL AND UPPER CERVICAL SPINE: The visualized skull base,
calvarium, upper cervical spine and extracranial soft tissues are
normal.

SINUSES/ORBITS: No fluid levels or advanced mucosal thickening. No
mastoid or middle ear effusion. The orbits are normal.
IMPRESSION: Normal MRI of the brain.  No seizure etiology identified.

## 2019-03-19 ENCOUNTER — Other Ambulatory Visit: Payer: Self-pay | Admitting: *Deleted

## 2019-03-19 DIAGNOSIS — Z20822 Contact with and (suspected) exposure to covid-19: Secondary | ICD-10-CM

## 2019-03-21 LAB — NOVEL CORONAVIRUS, NAA: SARS-CoV-2, NAA: NOT DETECTED

## 2019-03-26 ENCOUNTER — Other Ambulatory Visit: Payer: Self-pay

## 2019-03-26 DIAGNOSIS — F329 Major depressive disorder, single episode, unspecified: Secondary | ICD-10-CM | POA: Diagnosis present

## 2019-03-26 DIAGNOSIS — R45851 Suicidal ideations: Secondary | ICD-10-CM | POA: Diagnosis not present

## 2019-03-26 DIAGNOSIS — F4323 Adjustment disorder with mixed anxiety and depressed mood: Secondary | ICD-10-CM | POA: Insufficient documentation

## 2019-03-26 DIAGNOSIS — Z79899 Other long term (current) drug therapy: Secondary | ICD-10-CM | POA: Diagnosis not present

## 2019-03-26 LAB — COMPREHENSIVE METABOLIC PANEL
ALT: 17 U/L (ref 0–44)
AST: 17 U/L (ref 15–41)
Albumin: 4.4 g/dL (ref 3.5–5.0)
Alkaline Phosphatase: 50 U/L (ref 38–126)
Anion gap: 10 (ref 5–15)
BUN: 21 mg/dL — ABNORMAL HIGH (ref 6–20)
CO2: 24 mmol/L (ref 22–32)
Calcium: 9.1 mg/dL (ref 8.9–10.3)
Chloride: 104 mmol/L (ref 98–111)
Creatinine, Ser: 0.77 mg/dL (ref 0.44–1.00)
GFR calc Af Amer: >60 mL/min (ref >60)
GFR calc non Af Amer: >60 mL/min (ref >60)
Glucose, Bld: 87 mg/dL (ref 70–99)
Potassium: 3.5 mmol/L (ref 3.5–5.1)
Sodium: 138 mmol/L (ref 135–145)
Total Bilirubin: 0.7 mg/dL (ref 0.3–1.2)
Total Protein: 7.5 g/dL (ref 6.5–8.1)

## 2019-03-26 LAB — CBC
HCT: 38.7 % (ref 36.0–46.0)
Hemoglobin: 12.4 g/dL (ref 12.0–15.0)
MCH: 26.5 pg (ref 26.0–34.0)
MCHC: 32 g/dL (ref 30.0–36.0)
MCV: 82.7 fL (ref 80.0–100.0)
Platelets: 314 10*3/uL (ref 150–400)
RBC: 4.68 MIL/uL (ref 3.87–5.11)
RDW: 12.5 % (ref 11.5–15.5)
WBC: 9.1 10*3/uL (ref 4.0–10.5)
nRBC: 0 % (ref 0.0–0.2)

## 2019-03-26 LAB — ACETAMINOPHEN LEVEL: Acetaminophen (Tylenol), Serum: <10 ug/mL — ABNORMAL LOW (ref 10–30)

## 2019-03-26 LAB — SALICYLATE LEVEL: Salicylate Lvl: <7 mg/dL (ref 2.8–30.0)

## 2019-03-26 LAB — ETHANOL: Alcohol, Ethyl (B): <10 mg/dL (ref <10)

## 2019-03-26 NOTE — ED Notes (Addendum)
Pt given hospital scrubs and belongings bag. Belongings include:  Black shirt, black pants, black underwear, socks, black shoes, grey bra, brown belt, medication bottle. Medication is taken daily per pt.   Pt dressed out w/ this tech and officer [place], [employer].

## 2019-03-26 NOTE — ED Triage Notes (Signed)
Pt presents via POV c/o suicidal ideation. Reports had plan to overdose. Reports called 911 for help. Reports taking Concerta x1 extra pill this evening. A&Ox4.

## 2019-03-27 ENCOUNTER — Emergency Department
Admission: EM | Admit: 2019-03-27 | Discharge: 2019-03-27 | Disposition: A | Discharge status: Home / Self Care | Payer: Medicaid Other | Attending: Emergency Medicine | Admitting: Emergency Medicine

## 2019-03-27 DIAGNOSIS — R45851 Suicidal ideations: Secondary | ICD-10-CM

## 2019-03-27 DIAGNOSIS — F32A Depression, unspecified: Secondary | ICD-10-CM

## 2019-03-27 DIAGNOSIS — F329 Major depressive disorder, single episode, unspecified: Secondary | ICD-10-CM

## 2019-03-27 LAB — URINE DRUG SCREEN, QUALITATIVE (ARMC ONLY)
Amphetamines, Ur Screen: NOT DETECTED
Barbiturates, Ur Screen: NOT DETECTED
Benzodiazepine, Ur Scrn: NOT DETECTED
Cannabinoid 50 Ng, Ur ~~LOC~~: POSITIVE — AB
Cocaine Metabolite,Ur ~~LOC~~: NOT DETECTED
MDMA (Ecstasy)Ur Screen: NOT DETECTED
Methadone Scn, Ur: NOT DETECTED
Opiate, Ur Screen: NOT DETECTED
Phencyclidine (PCP) Ur S: NOT DETECTED
Tricyclic, Ur Screen: NOT DETECTED

## 2019-03-27 LAB — POCT PREGNANCY, URINE: Preg Test, Ur: NEGATIVE

## 2019-03-27 NOTE — Discharge Instructions (Addendum)
You have been seen in the emergency department for a  psychiatric concern. You have been evaluated both medically as well as psychiatrically. Please follow-up with your outpatient resources provided. Return to the emergency department for any worsening symptoms, or any thoughts of hurting yourself or anyone else so that we may attempt to help you. 

## 2019-03-27 NOTE — ED Notes (Signed)
Report given to Dr Cherly Beach from [facility]. Pt placed in consult room for assessment and is understanding of process.

## 2019-03-27 NOTE — ED Provider Notes (Signed)
[facility] Emergency Department Provider Note  ____________________________________________  Time seen: Approximately 3:12 AM  I have reviewed the triage vital signs and the nursing notes.   HISTORY  Chief Complaint Psychiatric Evaluation    HPI Melanie Gordon is a 18 y.o. female with a history of ADHD comes the ED due to suicidal thoughts earlier today.  Reports history of depression.  Earlier had a plan to overdose, and reports taking 1 extra Concerta pill today.  Currently feels back to normal, denies SI HI or hallucinations.  Denies any recent illness or acute pain complaints and feels back to normal.    Past Medical History:  Diagnosis Date  . ADHD (attention deficit hyperactivity disorder)      Patient Active Problem List   Diagnosis Date Noted  . Atopic dermatitis 05/15/2018  . ADHD (attention deficit hyperactivity disorder) 05/08/2018  . Irregular menses 05/08/2018  . Seizure-like activity (HCC) 02/08/2018     History reviewed. No pertinent surgical history.   Prior to Admission medications   Medication Sig Start Date End Date Taking? Authorizing Provider  methylphenidate 54 MG PO CR tablet Take 1 tablet (54 mg total) by mouth every morning. 03/08/19 04/07/19 Yes Doren Custard, FNP  fluticasone (FLONASE) 50 MCG/ACT nasal spray Place 2 sprays into both nostrils daily. 05/19/18   Poulose, Percell Belt, NP     Allergies Pollen extract   Family History  Adopted: Yes  Problem Relation Age of Onset  . Stroke Paternal Grandmother     Social History Social History   Tobacco Use  . Smoking status: Never Smoker  . Smokeless tobacco: Never Used  Substance Use Topics  . Alcohol use: No  . Drug use: No    Review of Systems  Constitutional:   No fever or chills.  ENT:   No sore throat. No rhinorrhea. Cardiovascular:   No chest pain or syncope. Respiratory:   No dyspnea or cough. Gastrointestinal:   Negative for abdominal pain,  vomiting and diarrhea.  Musculoskeletal:   Negative for focal pain or swelling All other systems reviewed and are negative except as documented above in ROS and HPI.  ____________________________________________   PHYSICAL EXAM:  VITAL SIGNS: ED Triage Vitals  Enc Vitals Group     BP 03/26/19 2255 119/75     Pulse Rate 03/26/19 2255 86     Resp 03/26/19 2255 14     Temp 03/26/19 2255 99 F (37.2 C)     Temp Source 03/26/19 2255 Oral     SpO2 03/26/19 2255 100 %     Weight --      Height --      Head Circumference --      Peak Flow --      Pain Score 03/26/19 2256 0     Pain Loc --      Pain Edu? --      Excl. in GC? --     Vital signs reviewed, nursing assessments reviewed.   Constitutional:   Alert and oriented. Non-toxic appearance. Eyes:   Conjunctivae are normal. EOMI.  ENT      Head:   Normocephalic and atraumatic.      Nose:   Wearing a mask.      Mouth/Throat:   Wearing a mask.      Neck:   No meningismus. Full ROM. Cardiovascular:   RRR. Symmetric bilateral radial and DP pulses.  No murmurs. Cap refill less than 2 seconds. Respiratory:   Normal  respiratory effort without tachypnea/retractions. Breath sounds are clear and equal bilaterally. No wheezes/rales/rhonchi. Musculoskeletal:   Normal range of motion in all extremities. No joint effusions.  No lower extremity tenderness.  No edema. Neurologic:   Normal speech and language.  Motor grossly intact. No acute focal neurologic deficits are appreciated.   ____________________________________________    LABS (pertinent positives/negatives) (all labs ordered are listed, but only abnormal results are displayed) Labs Reviewed  COMPREHENSIVE METABOLIC PANEL - Abnormal; Notable for the following components:      Result Value   BUN 21 (*)    All other components within normal limits  ACETAMINOPHEN LEVEL - Abnormal; Notable for the following components:   Acetaminophen (Tylenol), Serum <10 (*)    All other  components within normal limits  ETHANOL  SALICYLATE LEVEL  CBC  URINE DRUG SCREEN, QUALITATIVE ([facility] ONLY)  POC URINE PREG, ED   ____________________________________________   EKG    ____________________________________________    RADIOLOGY  No results found.  ____________________________________________   PROCEDURES Procedures  ____________________________________________    CLINICAL IMPRESSION / ASSESSMENT AND PLAN / ED COURSE  Medications ordered in the ED: Medications - No data to display  Pertinent labs & imaging results that were available during my care of the patient were reviewed by me and considered in my medical decision making (see chart for details).  Melanie Gordon was evaluated in Emergency Department on 03/27/2019 for the symptoms described in the history of present illness. She was evaluated in the context of the global COVID-19 pandemic, which necessitated consideration that the patient might be at risk for infection with the SARS-CoV-2 virus that causes COVID-19. Institutional protocols and algorithms that pertain to the evaluation of patients at risk for COVID-19 are in a state of rapid change based on information released by regulatory bodies including the CDC and federal and state organizations. These policies and algorithms were followed during the patient's care in the ED.   Patient presents with symptoms of suicidal ideation and taking extra dose of medicine today.  Currently symptoms have resolved, she is nontoxic and well-appearing, appears euthymic, not an imminent danger to herself or anyone else and not committable.  Will request psychiatry consult for further recommendations.      ____________________________________________   FINAL CLINICAL IMPRESSION(S) / ED DIAGNOSES    Final diagnoses:  Suicidal ideation     ED Discharge Orders    None      Portions of this note were generated with dragon dictation software. Dictation  errors may occur despite best attempts at proofreading.   Carrie Mew, MD 03/27/19 [phone]

## 2019-03-27 NOTE — BH Assessment (Addendum)
Assessment Note  Melanie Gordon is an 18 y.o. female. Who presents voluntarily to the ER with c/o SI. Pt with  mental health history as states below. she shares that following a  altercation with her friend friend group see became depressed, onset approximately two weeks ago. She also report work stress which trigger thoughts of suicide on today. Pt states that she began to take and overdose of medication but choose to 911 for help.Pt currently lives with her parents and states that she usually maintains a stable mood but has noticed more expansive moods lately. Ranging from extreme happiness to depression. Pt denies and current OPT. She denied the use of any illicit drugs or alcohol. Pt is able to contract for safety outside of the hospital system and denied current SI. Pt. denies any suicidal ideation, plan or intent. Pt. denies the presence of any auditory or visual hallucinations at this time. Patient denies any other medical complaints.    Diagnosis: Adjustment Disorder    Past Medical History:  Past Medical History:  Diagnosis Date  . ADHD (attention deficit hyperactivity disorder)     History reviewed. No pertinent surgical history.  Family History:  Family History  Adopted: Yes  Problem Relation Age of Onset  . Stroke Paternal Grandmother     Social History:  reports that she has never smoked. She has never used smokeless tobacco. She reports that she does not drink alcohol or use drugs.  Additional Social History:  Alcohol / Drug Use Pain Medications: SEE MAR Prescriptions: SEE MAR Over the Counter: SEE MAR History of alcohol / drug use?: Yes Substance #1 Name of Substance 1: THC 1 - Age of First Use: 18 1 - Amount (size/oz): Unknown 1 - Frequency: none 1 - Duration: none 1 - Last Use / Amount: week  CIWA: CIWA-Ar BP: 119/75 Pulse Rate: 86 COWS:    Allergies:  Allergies  Allergen Reactions  . Pollen Extract Other (See Comments)    Home Medications: (Not in a  hospital admission)   OB/GYN Status:  No LMP recorded.  General Assessment Data TTS Assessment: In system Is this a Tele or Face-to-Face Assessment?: Tele Assessment Is this an Initial Assessment or a Re-assessment for this encounter?: Initial Assessment Patient Accompanied by:: N/A Living Arrangements: Other (Comment) Living Arrangements: Parent Can pt return to current living arrangement?: Yes Admission Status: Voluntary Is patient capable of signing voluntary admission?: Yes Referral Source: Self/Family/Friend Insurance type: Medicaid   Medical Screening Exam [facility] Walk-in ONLY) Medical Exam completed: Yes  Crisis Care Plan Living Arrangements: Parent Legal Guardian: Other:(none ) Name of Psychiatrist: none  Name of Therapist: non e  Education Status Is patient currently in school?: No Current Grade: some college   Risk to self with the past 6 months Suicidal Ideation: No-Not Currently/Within Last 6 Months Has patient been a risk to self within the past 6 months prior to admission? : Yes Suicidal Intent: No-Not Currently/Within Last 6 Months Has patient had any suicidal intent within the past 6 months prior to admission? : Yes Is patient at risk for suicide?: Yes Suicidal Plan?: No-Not Currently/Within Last 6 Months Has patient had any suicidal plan within the past 6 months prior to admission? : Yes Specify Current Suicidal Plan: Overdose  Access to Means: Yes Specify Access to Suicidal Means: ADHD Medication  What has been your use of drugs/alcohol within the last 12 months?: THC  How many times?: 0 Other Self Harm Risks: none  Family Suicide History: No  Recent stressful life event(s): Conflict (Comment), Loss (Comment) Persecutory voices/beliefs?: No Depression: No Depression Symptoms: Feeling worthless/self pity, Feeling angry/irritable, Isolating Substance abuse history and/or treatment for substance abuse?: Yes Suicide prevention information given to  non-admitted patients: Not applicable  Risk to Others within the past 6 months Homicidal Ideation: No Does patient have any lifetime risk of violence toward others beyond the six months prior to admission? : No Thoughts of Harm to Others: No Current Homicidal Intent: No Current Homicidal Plan: No Access to Homicidal Means: No History of harm to others?: No Assessment of Violence: None Noted Does patient have access to weapons?: No Criminal Charges Pending?: No Does patient have a court date: No Is patient on probation?: No  Psychosis Hallucinations: None noted Delusions: None noted  Mental Status Report Appearance/Hygiene: In scrubs Eye Contact: Fair Motor Activity: Freedom of movement Speech: Logical/coherent Level of Consciousness: Alert Mood: Anxious Affect: Anxious Anxiety Level: Moderate Thought Processes: Relevant Judgement: Partial Orientation: Time, Place, Person, Situation Obsessive Compulsive Thoughts/Behaviors: None  Cognitive Functioning Concentration: Good Memory: Remote Intact, Recent Intact Is patient IDD: No Insight: Fair Impulse Control: Fair Appetite: Fair Have you had any weight changes? : Loss Sleep: No Change Total Hours of Sleep: 6 Vegetative Symptoms: None  ADLScreening [facility] Assessment Services) Patient's cognitive ability adequate to safely complete daily activities?: Yes Patient able to express need for assistance with ADLs?: Yes Independently performs ADLs?: Yes (appropriate for developmental age)  Prior Inpatient Therapy Prior Inpatient Therapy: No  Prior Outpatient Therapy Prior Outpatient Therapy: Yes Prior Therapy Dates: Over one year ago Prior Therapy Facilty/Provider(s): private therapist  Reason for Treatment: depression  Does patient have an ACCT team?: No Does patient have Intensive In-House Services?  : No Does patient have [facility] services? : No Does patient have P4CC services?: No  ADL Screening (condition at time  of admission) Patient's cognitive ability adequate to safely complete daily activities?: Yes Patient able to express need for assistance with ADLs?: Yes Independently performs ADLs?: Yes (appropriate for developmental age)       Abuse/Neglect Assessment (Assessment to be complete while patient is alone) Abuse/Neglect Assessment Can Be Completed: Yes Physical Abuse: Denies Verbal Abuse: Denies Sexual Abuse: Denies Exploitation of patient/patient's resources: Denies Self-Neglect: Denies Values / Beliefs Cultural Requests During Hospitalization: None Spiritual Requests During Hospitalization: None Consults Spiritual Care Consult Needed: No Social Work Consult Needed: No Regulatory affairs officer (For Healthcare) Does Patient Have a Medical Advance Directive?: No          Disposition:  Disposition Initial Assessment Completed for this Encounter: Yes Patient referred to: Other (Comment)(Consult with [facility] )  On Site Evaluation by:   Reviewed with Physician:    Laretta Alstrom 03/27/2019 1:41 AM

## 2019-03-27 NOTE — ED Notes (Signed)
Pt given phone to call ride 

## 2019-03-27 NOTE — ED Notes (Signed)
Pt's mother coming to pick her up.

## 2019-03-27 NOTE — ED Notes (Signed)
Pt sleeping and awaiting TTS resources. Pt cleared by SOC/psych.

## 2019-03-27 NOTE — ED Provider Notes (Signed)
-----------------------------------------   7:40 AM on 03/27/2019 -----------------------------------------  Patient has been seen and evaluated by psychiatry.  They believe the patient is safe for discharge home from a psychiatric standpoint.  Medical work-up has been largely nonrevealing.  We will discharge with outpatient resources.   Harvest Dark, MD 03/27/19 [phone]

## 2019-03-27 NOTE — ED Notes (Signed)
Pt given 1/1 belongings bag to pt. Pt changed back into clothes. Pt wants to wait in lobby. OK to wait in lobby for ride.

## 2019-04-02 ENCOUNTER — Ambulatory Visit (INDEPENDENT_AMBULATORY_CARE_PROVIDER_SITE_OTHER): Payer: Medicaid Other | Admitting: Family Medicine

## 2019-04-02 ENCOUNTER — Other Ambulatory Visit: Payer: Self-pay

## 2019-04-02 ENCOUNTER — Encounter: Payer: Self-pay | Admitting: Family Medicine

## 2019-04-02 VITALS — BP 122/72 | HR 88 | Temp 97.8°F | Resp 16 | Ht 67.0 in | Wt 138.6 lb

## 2019-04-02 DIAGNOSIS — F909 Attention-deficit hyperactivity disorder, unspecified type: Secondary | ICD-10-CM

## 2019-04-02 MED ORDER — AMPHETAMINE-DEXTROAMPHET ER 10 MG PO CP24: 10.0000 mg | ORAL_CAPSULE | Freq: Every day | ORAL | 0 refills | Status: DC

## 2019-04-02 NOTE — Patient Instructions (Signed)
Here are some resources to help you if you feel you are in a mental health crisis:  National Suicide Prevention Lifeline - Call 1-800-273-8255  for help - Website with more resources: https://suicidepreventionlifeline.org/  [facility] - Call 866-275-9552 for help. - Mobile Crisis Program available 24 hours a day, 365 days a year. - Available for anyone of any age in [place] counties.  [facility] - Address: [address], [place] - Telephone: [phone]  - Hours of Operation: Sunday - Saturday - 8:00 a.m. - 8:00 p.m. - Medicaid, Medicare (Government Issued Only), BCBS, and Cash - Pay - Crisis Management, Outpatient Individual & Group Therapy, Psychiatrists on-site to provide medication management, In-Home Psychiatric Care, and Peer Support Care.  National Mobile Crisis: 1-800-939-5911 - Mobile Crisis Program available 24 hours a day, 365 days a year. - Available for anyone of any age in [place]    

## 2019-04-02 NOTE — Progress Notes (Signed)
Name: Melanie Gordon   MRN: [id number]    DOB: 04-07-2001   Date:04/02/2019       Progress Note  Subjective  Chief Complaint  Chief Complaint  Patient presents with  . Medication Refill    ADD    HPI  Melanie Gordon for Concerta refill. She has been on 54mg  for several years (since middle or early high school).The medication is lasting the entire day without issues. She has no palpitations, chest pain, shortness of breath, no sleeping issues, appetite is adequate, weight is stable. 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  She notes that she feels too serious on the methylphenidate - feeling like this keeps her mood too low.  She has never tried Adderall before; has been on Vyvanse in the past.  We will switch to 10mg  XR Adderall for 30 days to see if this works for her.   History SI: She was seen in ER for SI.  She was being bullied by some friends who she says "betrayed" her.  She states these friends were using her for rides and money, but has cut them out of her life and blocked them on social media. She has been feeling like her methylphenidate makes her feel too serious and overly focused throughout the day.  We will try switching medication. PHQ-9 is negative.  Denies SI/HI today.  We will consider SSRI at next visit.     Office Visit from 04/02/2019 in [facility]  PHQ-9 Total Score  2       Patient Active Problem List   Diagnosis Date Noted  . Atopic dermatitis 05/15/2018  . ADHD (attention deficit hyperactivity disorder) 05/08/2018  . Irregular menses 05/08/2018  . Seizure-like activity (Industry) 02/08/2018    No past surgical history on file.  Family History  Adopted: Yes  Problem Relation Age of Onset  . Stroke Paternal Grandmother     Social History   Socioeconomic History  . Marital status: Single    Spouse name: Not on file  .  Number of children: 0  . Years of education: Not on file  . Highest education level: Some college, no degree  Occupational History  . Not on file  Social Needs  . Financial resource strain: Somewhat hard  . Food insecurity    Worry: Never true    Inability: Never true  . Transportation needs    Medical: No    Non-medical: No  Tobacco Use  . Smoking status: Never Smoker  . Smokeless tobacco: Never Used  Substance and Sexual Activity  . Alcohol use: No  . Drug use: No  . Sexual activity: Not Currently    Partners: Male    Birth control/protection: None  Lifestyle  . Physical activity    Days per week: 7 days    Minutes per session: 10 min  . Stress: Not at all  Relationships  . Social connections    Talks on phone: More than three times a week    Gets together: More than three times a week    Attends religious service: More than 4 times per year    Active member of club or organization: No    Attends meetings of clubs or organizations: Never    Relationship status: Never married  . Intimate partner violence    Fear of current or ex partner: No  Emotionally abused: No    Physically abused: No    Forced sexual activity: No  Other Topics Concern  . Not on file  Social History Narrative  . Not on file     Current Outpatient Medications:  .  methylphenidate 54 MG PO CR tablet, Take 1 tablet (54 mg total) by mouth every morning., Disp: 30 tablet, Rfl: 0 .  fluticasone (FLONASE) 50 MCG/ACT nasal spray, Place 2 sprays into both nostrils daily. (Patient not taking: Reported on 04/02/2019), Disp: 16 g, Rfl: 6  Allergies  Allergen Reactions  . Pollen Extract Other (See Comments)    I personally reviewed active problem list, medication list, allergies, notes from last encounter, lab results with the patient/caregiver today.   ROS  Constitutional: Negative for fever or weight change.  Respiratory: Negative for cough and shortness of breath.   Cardiovascular: Negative  for chest pain or palpitations.  Gastrointestinal: Negative for abdominal pain, no bowel changes.  Musculoskeletal: Negative for gait problem or joint swelling.  Skin: Negative for rash.  Neurological: Negative for dizziness or headache.  No other specific complaints in a complete review of systems (except as listed in HPI above)  Objective  Vitals:   04/02/19 0854  BP: 122/72  Pulse: 88  Resp: 16  Temp: 97.8 F (36.6 C)  TempSrc: Temporal  SpO2: 99%  Weight: 138 lb 9.6 oz (62.9 kg)  Height: 5\' 7"  (1.702 m)   Body mass index is 21.71 kg/m.  Physical Exam  Constitutional: Patient appears well-developed and well-nourished. No distress.  HENT: Head: Normocephalic and atraumatic. Eyes: Conjunctivae and EOM are normal. No scleral icterus.  Neck: Normal range of motion. Neck supple. No JVD present. .  Cardiovascular: Normal rate, regular rhythm and normal heart sounds.  No murmur heard. No BLE edema. Pulmonary/Chest: Effort normal and breath sounds normal. No respiratory distress. Musculoskeletal: Normal range of motion, no joint effusions. No gross deformities Neurological: Pt is alert and oriented to person, place, and time. No cranial nerve deficit. Coordination, balance, strength, speech and gait are normal.  Skin: Skin is warm and dry. No rash noted. No erythema.  Psychiatric: Patient has a normal mood and affect. behavior is normal. Judgment and thought content normal.  No results found for this or any previous visit (from the past 72 hour(s)).  PHQ2/9: Depression screen [facility] 2/9 04/02/2019 01/01/2019 10/02/2018 07/07/2018 05/08/2018  Decreased Interest 1 0 0 0 0  Down, Depressed, Hopeless 1 0 0 0 0  PHQ - 2 Score 2 0 0 0 0  Altered sleeping 0 0 0 0 0  Tired, decreased energy 0 0 0 0 0  Change in appetite 0 0 0 0 0  Feeling bad or failure about yourself  0 0 0 0 0  Trouble concentrating 0 0 0 0 0  Moving slowly or fidgety/restless 0 0 0 0 0  Suicidal thoughts 0 0 0 0 0   PHQ-9 Score 2 0 0 0 0  Difficult doing work/chores - Not difficult at all Not difficult at all Not difficult at all Not difficult at all   PHQ-2/9 Result is negative.    Fall Risk: Fall Risk  04/02/2019 01/01/2019 10/02/2018 07/07/2018 05/19/2018  Falls in the past year? 0 0 0 0 1  Comment - - - - Has slipped at work due to floors being slippery  Number falls in past yr: 0 0 0 0 1  Injury with Fall? 0 0 0 0 1  Follow up Falls evaluation  completed Falls evaluation completed - Falls evaluation completed -    Assessment & Plan  1. Attention deficit hyperactivity disorder (ADHD), unspecified ADHD type - Switch to Adderall XR- follow up in 30 days.  Discussed what to do if SI occurs and she contracts for safety.  - amphetamine-dextroamphetamine (ADDERALL XR) 10 MG 24 hr capsule; Take 1 capsule (10 mg total) by mouth daily.  Dispense: 30 capsule; Refill: 0

## 2019-05-03 ENCOUNTER — Ambulatory Visit: Payer: Medicaid Other | Admitting: Family Medicine

## 2019-05-05 ENCOUNTER — Other Ambulatory Visit: Payer: Self-pay

## 2019-05-05 ENCOUNTER — Telehealth: Payer: Medicaid Other | Admitting: Family Medicine

## 2019-05-06 ENCOUNTER — Encounter: Payer: Self-pay | Admitting: Family Medicine

## 2019-05-06 ENCOUNTER — Other Ambulatory Visit: Payer: Self-pay

## 2019-05-06 ENCOUNTER — Telehealth (INDEPENDENT_AMBULATORY_CARE_PROVIDER_SITE_OTHER): Payer: Medicaid Other | Admitting: Family Medicine

## 2019-05-06 DIAGNOSIS — F909 Attention-deficit hyperactivity disorder, unspecified type: Secondary | ICD-10-CM

## 2019-05-06 MED ORDER — AMPHETAMINE-DEXTROAMPHET ER 15 MG PO CP24: 15.0000 mg | ORAL_CAPSULE | ORAL | 0 refills | Status: DC

## 2019-05-06 NOTE — Progress Notes (Signed)
Name: Melanie Gordon   MRN: [id number]    DOB: July 03, 2000   Date:05/06/2019       Progress Note  Subjective  Chief Complaint  Chief Complaint  Patient presents with  . Medication Refill    follow up adderall    I connected with  [name]  on 05/06/19 at 10:40 AM EST by a video enabled telemedicine application and verified that I am speaking with the correct person using two identifiers.  I discussed the limitations of evaluation and management by telemedicine and the availability of in person appointments. The patient expressed understanding and agreed to proceed. Staff also discussed with the patient that there may be a patient responsible charge related to this service. Patient Location: Home Provider Location: Office Additional Individuals present: None  HPI   Mood has improved significantly since switching from Concerta 54mg  to 10mg  Adderall XR - feels much happier and has had very few "low mood" days.  Eating and drinking without issue - no appetite suppression.  She does note that there are days where the medication does not work as well for her concentration and she feels a lack of focus - discussed increasing to 15mg  and she is agreeable.  Denies palpitations, chest pain, shortness of breath.    Social: She is not in school anymore,working at to a for [employer] not been flying due to COVID-19 pandemic).  She notes that she feels too serious on the methylphenidate - feeling like this keeps her mood too low.  She has never tried Adderall before; has been on Vyvanse in the past.  We will switch to 10mg  XR Adderall for 30 days to see if this works for her.   Patient Active Problem List   Diagnosis Date Noted  . Atopic dermatitis 05/15/2018  . ADHD (attention deficit hyperactivity disorder) 05/08/2018  . Irregular menses 05/08/2018  . Seizure-like activity (HCC) 02/08/2018    Social History   Tobacco Use  . Smoking status: Never Smoker   . Smokeless tobacco: Never Used  Substance Use Topics  . Alcohol use: No     Current Outpatient Medications:  .  amphetamine-dextroamphetamine (ADDERALL XR) 10 MG 24 hr capsule, Take 1 capsule (10 mg total) by mouth daily., Disp: 30 capsule, Rfl: 0  Allergies  Allergen Reactions  . Pollen Extract Other (See Comments)    I personally reviewed active problem list, medication list, allergies, notes from last encounter, lab results with the patient/caregiver today.  ROS  Constitutional: Negative for fever or weight change.  Respiratory: Negative for cough and shortness of breath.   Cardiovascular: Negative for chest pain or palpitations.  Gastrointestinal: Negative for abdominal pain, no bowel changes.  Musculoskeletal: Negative for gait problem or joint swelling.  Skin: Negative for rash.  Neurological: Negative for dizziness or headache.  No other specific complaints in a complete review of systems (except as listed in HPI above)  Objective  Virtual encounter, vitals not obtained.  There is no height or weight on file to calculate BMI.  Nursing Note and Vital Signs reviewed.  Physical Exam  Constitutional: Patient appears well-developed and well-nourished. No distress.  HENT: Head: Normocephalic and atraumatic.  Neck: Normal range of motion. Pulmonary/Chest: Effort normal. No respiratory distress. Speaking in complete sentences Neurological: Pt is alert and oriented to person, place, and time. Coordination, speech are normal.  Psychiatric: Patient has a normal mood and affect. behavior is normal. Judgment and thought content normal.  Depression  screen [facility] 2/9 05/06/2019 04/02/2019 01/01/2019 10/02/2018 07/07/2018  Decreased Interest 0 1 0 0 0  Down, Depressed, Hopeless 0 1 0 0 0  PHQ - 2 Score 0 2 0 0 0  Altered sleeping 0 0 0 0 0  Tired, decreased energy 0 0 0 0 0  Change in appetite 0 0 0 0 0  Feeling bad or failure about yourself  0 0 0 0 0  Trouble concentrating 0  0 0 0 0  Moving slowly or fidgety/restless 0 0 0 0 0  Suicidal thoughts 0 0 0 0 0  PHQ-9 Score 0 2 0 0 0  Difficult doing work/chores Not difficult at all - Not difficult at all Not difficult at all Not difficult at all   PHQ-9 Score is negative  No results found for this or any previous visit (from the past 72 hour(s)).  Assessment & Plan  1. Attention deficit hyperactivity disorder (ADHD), unspecified ADHD type - We will increase her dosing slightly from 20mg  to 15mg  to obtain more consistent control of concentration and task completion.   - amphetamine-dextroamphetamine (ADDERALL XR) 15 MG 24 hr capsule; Take 1 capsule by mouth every morning.  Dispense: 30 capsule; Refill: 0 - amphetamine-dextroamphetamine (ADDERALL XR) 15 MG 24 hr capsule; Take 1 capsule by mouth every morning.  Dispense: 30 capsule; Refill: 0 - amphetamine-dextroamphetamine (ADDERALL XR) 15 MG 24 hr capsule; Take 1 capsule by mouth every morning.  Dispense: 30 capsule; Refill: 0  - I discussed the assessment and treatment plan with the patient. The patient was provided an opportunity to ask questions and all were answered. The patient agreed with the plan and demonstrated an understanding of the instructions.  I provided 12 minutes of non-face-to-face time during this encounter.  Hubbard Hartshorn, FNP

## 2019-06-21 ENCOUNTER — Telehealth: Payer: Self-pay

## 2019-06-21 DIAGNOSIS — L209 Atopic dermatitis, unspecified: Secondary | ICD-10-CM

## 2019-06-21 NOTE — Telephone Encounter (Signed)
Copied from CRM [id number]. Topic: General - Other >> Jun 21, 2019  2:27 PM Herby Abraham C wrote: Reason for CRM:   Pt is requesting a new referral to current dermatology office [facility] on [address] , pt says that she was told because it is a new year a new referral is needed.    CB: [phone]   Please place new referral

## 2019-06-22 NOTE — Telephone Encounter (Signed)
Referral is placed.  Thanks!

## 2019-08-04 ENCOUNTER — Ambulatory Visit: Payer: Medicaid Other | Admitting: Family Medicine

## 2019-08-06 ENCOUNTER — Telehealth (INDEPENDENT_AMBULATORY_CARE_PROVIDER_SITE_OTHER): Payer: Medicaid Other | Admitting: Family Medicine

## 2019-08-06 ENCOUNTER — Encounter: Payer: Self-pay | Admitting: Family Medicine

## 2019-08-06 VITALS — Ht 66.0 in | Wt 135.0 lb

## 2019-08-06 DIAGNOSIS — R1084 Generalized abdominal pain: Secondary | ICD-10-CM

## 2019-08-06 DIAGNOSIS — F909 Attention-deficit hyperactivity disorder, unspecified type: Secondary | ICD-10-CM | POA: Diagnosis not present

## 2019-08-06 DIAGNOSIS — L989 Disorder of the skin and subcutaneous tissue, unspecified: Secondary | ICD-10-CM

## 2019-08-06 MED ORDER — AMPHETAMINE-DEXTROAMPHET ER 15 MG PO CP24: 15.0000 mg | ORAL_CAPSULE | ORAL | 0 refills | Status: DC

## 2019-08-06 MED ORDER — PANTOPRAZOLE SODIUM 20 MG PO TBEC: 20.0000 mg | DELAYED_RELEASE_TABLET | Freq: Every day | ORAL | 1 refills | Status: DC

## 2019-08-06 NOTE — Progress Notes (Signed)
Name: Melanie Gordon   MRN: [id number]    DOB: Mar 23, 2001   Date:08/06/2019       Progress Note  Subjective:    Chief Complaint  Chief Complaint  Patient presents with  . Follow-up  . ADHD  . Medication Refill    Aderrall    I connected with  Daryll Brod  on 08/06/19 at  3:20 PM EDT by a video enabled telemedicine application and verified that I am speaking with the correct person using two identifiers.  I discussed the limitations of evaluation and management by telemedicine and the availability of in person appointments. The patient expressed understanding and agreed to proceed. Staff also discussed with the patient that there may be a patient responsible charge related to this service. Patient Location: home Provider Location: [facility] office Additional Individuals present: none  HPI  ADHD: Recent med adjustment from 10 to 15 mg and change from concerta to adderall XR Compliant with meds:   Yes  Pt is taking daily in AM, taking every day She has mixed hyperactivity and inattentiveness   Benefit from med (ie increase in concentration): Yes Pt denies CP, rapid HR, dry mouth, weight loss, difficulty sleeping, agitation/mood disturbances. Controlled Substance Database is checked and no suspicious findings.   She also has a bump to her right scalp -she states that it fell like visit and her mom popped it about a month ago and she thought that it should go away but it has not resolved and feels like a bump it is not very painful there is no peeling or purulent discharge she just notices it when she brushes her hair puts on sunglasses.  She is unable to show me with the phone on the video encounter close so I cannot see it but she points to her right scalp just about 2 cm superior to her ear  She is also having some stomach pain that is sharp and severe and it makes it so she can't move, it comes every day the same time of day, usually last ~3 min she feels like she's she's going to throw  up.  Patient states the location of pain is all over her stomach but in the lower portion below her bellybutton, usually occurs every morning a few minutes after waking up.   She thinks that she has abnormal frequency of bowel movements she states that she is having 4-6 soft BM's a day, stool is very soft, she feels like she has to go a lot but then it usually is very slow when she sits on the toilet and feels that she is unable to push out a formed bowel movement and slow and soft and she has to "sit and wait for soft BM to happen."  She denies any vomiting she does have some indigestion and postprandial fullness she did not have any urinary symptoms no frequency urgency dysuria, she states that she has had the symptoms for many months but she has been afraid to talk about with anyone because she does not quite know what to say or how to describe it or she can want to say anything wrong today she states she thinks she is having a GI problem.  She has not tried any medications for it.  No melena hematochezia, weight changes.  No fever chills sweats rash, no recent Abx    Patient Active Problem List   Diagnosis Date Noted  . Atopic dermatitis 05/15/2018  . ADHD (attention deficit hyperactivity disorder)  05/08/2018  . Irregular menses 05/08/2018  . Seizure-like activity (HCC) 02/08/2018    Social History   Tobacco Use  . Smoking status: Never Smoker  . Smokeless tobacco: Never Used  Substance Use Topics  . Alcohol use: No     Current Outpatient Medications:  .  amphetamine-dextroamphetamine (ADDERALL XR) 15 MG 24 hr capsule, Take 1 capsule by mouth every morning., Disp: 30 capsule, Rfl: 0 .  amphetamine-dextroamphetamine (ADDERALL XR) 15 MG 24 hr capsule, Take 1 capsule by mouth every morning., Disp: 30 capsule, Rfl: 0 .  amphetamine-dextroamphetamine (ADDERALL XR) 15 MG 24 hr capsule, Take 1 capsule by mouth every morning., Disp: 30 capsule, Rfl: 0  Allergies  Allergen Reactions  .  Pollen Extract Other (See Comments)    I personally reviewed active problem list, medication list, allergies, family history, social history, health maintenance, notes from last encounter, lab results, imaging with the patient/caregiver today.   Review of Systems  Constitutional: Negative.   HENT: Negative.   Eyes: Negative.   Respiratory: Negative.   Cardiovascular: Negative.   Gastrointestinal: Negative.   Endocrine: Negative.   Genitourinary: Negative.   Musculoskeletal: Negative.   Skin: Negative.   Allergic/Immunologic: Negative.   Neurological: Negative.   Hematological: Negative.   Psychiatric/Behavioral: Negative.   All other systems reviewed and are negative.     Objective:   Virtual encounter, vitals limited, only able to obtain the following Today's Vitals   08/06/19 1508  Weight: 135 lb (61.2 kg)  Height: 5\' 6"  (1.676 m)   Body mass index is 21.79 kg/m. Nursing Note and Vital Signs reviewed.  Physical Exam Vitals and nursing note reviewed.  Constitutional:      General: She is not in acute distress.    Appearance: She is not ill-appearing, toxic-appearing or diaphoretic.     Comments: Well-appearing young female no acute distress  HENT:     Head: Normocephalic and atraumatic.  Pulmonary:     Effort: No respiratory distress.  Abdominal:     General: There is no distension.  Musculoskeletal:        General: Normal range of motion.  Skin:    Coloration: Skin is not pale.  Neurological:     Mental Status: She is alert.  Psychiatric:        Mood and Affect: Mood normal.        Behavior: Behavior normal.     PE limited by telephone encounter  No results found for this or any previous visit (from the past 72 hour(s)).  Assessment and Plan:    1. Attention deficit hyperactivity disorder (ADHD), unspecified ADHD type Recent dose medication change patient states she is doing really well with extended release Adderall with a slight dose increase  from 10 mg to 15.  No adverse side effects no red flags.  Patient's weight has been stable no tachycardia, chest pain, mood changes.  Controlled substance database was consulted she has had consistent medications prescribed and picked up no red flags there sent through 3 months of refills postdated - amphetamine-dextroamphetamine (ADDERALL XR) 15 MG 24 hr capsule; Take 1 capsule by mouth every morning.  Dispense: 30 capsule; Refill: 0 - amphetamine-dextroamphetamine (ADDERALL XR) 15 MG 24 hr capsule; Take 1 capsule by mouth every morning.  Dispense: 30 capsule; Refill: 0 - amphetamine-dextroamphetamine (ADDERALL XR) 15 MG 24 hr capsule; Take 1 capsule by mouth every morning.  Dispense: 30 capsule; Refill: 0  2. Generalized abdominal pain Patient states she is been having many  months of severe abdominal pain that is generalized to the lower portion of her abdomen occurs daily after waking very severe causes her to feel like she cannot move or walk feels nauseous but does not have any vomiting and then it resolves.  She has no other associated symptoms with the pain episodes but she does have some bloating, fullness and indigestion meals and shortly afterwards and she has soft frequent bowel movements usually 4 to 6/day without any melena hematochezia abdominal cramping.  No GU sx, no weight changes no recent infection or antibiotic use. Did clinic patient did want to examine her in person to do good abdominal exam possibly get some blood work may be a KUB -so she was encouraged to make a in person office visit for better evaluation Since her symptoms occur in the morning it may be somewhat related to GERD which would also be consistent with bloating fullness throughout the day?  May be related to her ADHD medications but I would expect those to be throughout the day not just upon waking and before she takes her medications. - pantoprazole (PROTONIX) 20 MG tablet; Take 1 tablet (20 mg total) by mouth daily  before breakfast.  Dispense: 30 tablet; Refill: 1  3. Scalp lesion Scalp lesion sounds like a possible boil or epidermal cyst or possibly a small lesion that they may push on is healing.  Cannot see this during the exam I encouraged her to send [place] some photographs through my chart if she is able to her come in person if any worsening she was instructed to use warm compresses topical over-the-counter antibiotic ointment see if it resolves and follow-up if no improvement  - I discussed the assessment and treatment plan with the patient. The patient was provided an opportunity to ask questions and all were answered. The patient agreed with the plan and demonstrated an understanding of the instructions.  I provided 30+  minutes of non-face-to-face time during this encounter, 15 min + were spent on the virtual encounter directly with the patient remainder of time was spent reviewing patient's chart, last office visit, controlled substance database, prescribing controlled substances, chart documentation, and coordinating treatment and follow-up plan  [name], PA-C 08/06/19 3:48 PM

## 2019-08-26 ENCOUNTER — Encounter: Payer: Self-pay | Admitting: Family Medicine

## 2019-09-07 ENCOUNTER — Encounter: Payer: Self-pay | Admitting: Family Medicine

## 2019-09-07 ENCOUNTER — Other Ambulatory Visit (HOSPITAL_COMMUNITY)
Admission: RE | Admit: 2019-09-07 | Discharge: 2019-09-07 | Disposition: A | Discharge status: Home / Self Care | Payer: Medicaid Other | Source: Ambulatory Visit | Attending: Family Medicine | Admitting: Family Medicine

## 2019-09-07 ENCOUNTER — Other Ambulatory Visit: Payer: Self-pay

## 2019-09-07 ENCOUNTER — Ambulatory Visit (INDEPENDENT_AMBULATORY_CARE_PROVIDER_SITE_OTHER): Payer: Medicaid Other | Admitting: Family Medicine

## 2019-09-07 VITALS — BP 94/58 | HR 85 | Temp 97.1°F | Resp 16 | Ht 67.0 in | Wt 142.1 lb

## 2019-09-07 DIAGNOSIS — L209 Atopic dermatitis, unspecified: Secondary | ICD-10-CM

## 2019-09-07 DIAGNOSIS — Z23 Encounter for immunization: Secondary | ICD-10-CM | POA: Diagnosis not present

## 2019-09-07 DIAGNOSIS — Z118 Encounter for screening for other infectious and parasitic diseases: Secondary | ICD-10-CM

## 2019-09-07 DIAGNOSIS — J301 Allergic rhinitis due to pollen: Secondary | ICD-10-CM

## 2019-09-07 MED ORDER — OLOPATADINE HCL 0.1 % OP SOLN: 1.0000 [drp] | Freq: Two times a day (BID) | OPHTHALMIC | 2 refills | Status: DC

## 2019-09-07 MED ORDER — CETIRIZINE HCL 10 MG PO TABS: 10.0000 mg | ORAL_TABLET | Freq: Every day | ORAL | 11 refills | Status: DC

## 2019-09-07 MED ORDER — FLUTICASONE PROPIONATE 50 MCG/ACT NA SUSP: 2.0000 | Freq: Every day | NASAL | 6 refills | Status: DC

## 2019-09-07 NOTE — Progress Notes (Signed)
Name: Melanie Gordon   MRN: [id number]    DOB: 2000/09/27   Date:09/07/2019       Progress Note  Subjective  Chief Complaint  Chief Complaint  Patient presents with  . Referral    Needs referral to Dermatology for treatment for her skin.    HPI  Atopic Dermatitis: she was diagnosed with atopic dermatitis in  middle school , she states when she started Duypixen her skin cleared completely. She has been out of the medication for about one month and has itchy also dry patches that is worse on her hands, she needs a referral to go back to Dermatologist   AR: taking Zyrtec , Flonase, she states symptoms controlled, no rhinorrhea or nasal congestion but has itchy eyes, she has some eye drops, but not sure of the name   Patient Active Problem List   Diagnosis Date Noted  . Atopic dermatitis 05/15/2018  . ADHD (attention deficit hyperactivity disorder) 05/08/2018  . Irregular menses 05/08/2018  . Seizure-like activity ([place]) 02/08/2018    History reviewed. No pertinent surgical history.  Family History  Adopted: Yes  Problem Relation Age of Onset  . Stroke Paternal Grandmother     Social History   Tobacco Use  . Smoking status: Never Smoker  . Smokeless tobacco: Never Used  Substance Use Topics  . Alcohol use: No     Current Outpatient Medications:  .  [START ON 10/06/2019] amphetamine-dextroamphetamine (ADDERALL XR) 15 MG 24 hr capsule, Take 1 capsule by mouth every morning., Disp: 30 capsule, Rfl: 0 .  amphetamine-dextroamphetamine (ADDERALL XR) 15 MG 24 hr capsule, Take 1 capsule by mouth every morning., Disp: 30 capsule, Rfl: 0 .  pantoprazole (PROTONIX) 20 MG tablet, Take 1 tablet (20 mg total) by mouth daily before breakfast., Disp: 30 tablet, Rfl: 1 .  amphetamine-dextroamphetamine (ADDERALL XR) 15 MG 24 hr capsule, Take 1 capsule by mouth every morning., Disp: 30 capsule, Rfl: 0 .  cetirizine (ZYRTEC) 10 MG tablet, Take 1 tablet (10 mg total) by mouth daily., Disp: 30  tablet, Rfl: 11 .  fluticasone (FLONASE) 50 MCG/ACT nasal spray, Place 2 sprays into both nostrils daily., Disp: 16 g, Rfl: 6 .  olopatadine (PATANOL) 0.1 % ophthalmic solution, Place 1 drop into both eyes 2 (two) times daily., Disp: 5 mL, Rfl: 2  Allergies  Allergen Reactions  . Pollen Extract Other (See Comments)    I personally reviewed active problem list, medication list, allergies, family history, social history, health maintenance with the patient/caregiver today.   ROS  Ten systems reviewed and is negative except as mentioned in HPI   Objective  Vitals:   09/07/19 0947  BP: (!) 94/58  Pulse: 85  Resp: 16  Temp: (!) 97.1 F (36.2 C)  TempSrc: Temporal  SpO2: 98%  Weight: 142 lb 1.6 oz (64.5 kg)  Height: 5\' 7"  (1.702 m)    Body mass index is 22.26 kg/m.  Physical Exam  Constitutional: Patient appears well-developed and well-nourished.No distress.  HEENT: head atraumatic, normocephalic, pupils equal and reactive to light Cardiovascular: Normal rate, regular rhythm and normal heart sounds.  No murmur heard. No BLE edema. Pulmonary/Chest: Effort normal and breath sounds normal. No respiratory distress. Abdominal: Soft.  There is no tenderness. Skin: dry hands, no erythema or oozing at this time, also on ventral wrist  Psychiatric: Patient has a normal mood and affect. behavior is normal. Judgment and thought content normal.   PHQ2/9: Depression screen [facility] 2/9 09/07/2019 08/06/2019 05/06/2019 04/02/2019  01/01/2019  Decreased Interest 0 0 0 1 0  Down, Depressed, Hopeless 0 0 0 1 0  PHQ - 2 Score 0 0 0 2 0  Altered sleeping 0 0 0 0 0  Tired, decreased energy 0 0 0 0 0  Change in appetite 0 0 0 0 0  Feeling bad or failure about yourself  0 0 0 0 0  Trouble concentrating 0 0 0 0 0  Moving slowly or fidgety/restless 0 0 0 0 0  Suicidal thoughts 0 0 0 0 0  PHQ-9 Score 0 0 0 2 0  Difficult doing work/chores - Not difficult at all Not difficult at all - Not difficult  at all    phq 9 is negative   Fall Risk: Fall Risk  09/07/2019 08/06/2019 05/06/2019 04/02/2019 01/01/2019  Falls in the past year? 0 0 0 0 0  Comment - - - - -  Number falls in past yr: 0 0 0 0 0  Injury with Fall? 0 0 0 0 0  Follow up - - Falls evaluation completed Falls evaluation completed Falls evaluation completed      Assessment & Plan  1. Atopic dermatitis, unspecified type  - Ambulatory referral to Dermatology  2. Need for Tdap vaccination  - Tdap vaccine greater than or equal to 7yo IM  3. Screening for chlamydial disease  - Urine cytology ancillary only  4. Seasonal allergic rhinitis due to pollen  - cetirizine (ZYRTEC) 10 MG tablet; Take 1 tablet (10 mg total) by mouth daily.  Dispense: 30 tablet; Refill: 11 - fluticasone (FLONASE) 50 MCG/ACT nasal spray; Place 2 sprays into both nostrils daily.  Dispense: 16 g; Refill: 6 - olopatadine (PATANOL) 0.1 % ophthalmic solution; Place 1 drop into both eyes 2 (two) times daily.  Dispense: 5 mL; Refill: 2

## 2019-09-08 LAB — URINE CYTOLOGY ANCILLARY ONLY
Chlamydia: NEGATIVE
Comment: NEGATIVE
Comment: NORMAL
Neisseria Gonorrhea: NEGATIVE

## 2019-09-13 ENCOUNTER — Telehealth: Payer: Self-pay

## 2019-09-13 NOTE — Telephone Encounter (Signed)
Copied from CRM [id number]. Topic: Referral - Status >> Sep 13, 2019 10:05 AM Wyonia Hough E wrote: Reason for CRM: Pt was advised she couldn't get a referral to [facility] due to her needing to change her medicaid card from [place] [facility] to [facility]/ Pts mom stated they have changed it and she will bring new card by the office/ please advise as far as sending referral

## 2019-11-05 ENCOUNTER — Other Ambulatory Visit: Payer: Self-pay | Admitting: Family Medicine

## 2019-11-05 DIAGNOSIS — F909 Attention-deficit hyperactivity disorder, unspecified type: Secondary | ICD-10-CM

## 2019-11-05 NOTE — Telephone Encounter (Signed)
Pt needs refill for ADDERALL and has scheduled next available appt in late July/ Pt is out of medication and would like to get a courtesy refill until her appt or be seen sooner / please advise

## 2019-11-08 MED ORDER — AMPHETAMINE-DEXTROAMPHET ER 15 MG PO CP24: 15.0000 mg | ORAL_CAPSULE | ORAL | 0 refills | Status: DC

## 2019-11-12 ENCOUNTER — Ambulatory Visit (INDEPENDENT_AMBULATORY_CARE_PROVIDER_SITE_OTHER): Payer: Medicaid Other | Admitting: Family Medicine

## 2019-11-12 ENCOUNTER — Encounter: Payer: Self-pay | Admitting: Family Medicine

## 2019-11-12 ENCOUNTER — Other Ambulatory Visit: Payer: Self-pay

## 2019-11-12 VITALS — BP 110/70 | HR 109 | Temp 97.5°F | Resp 16 | Ht 67.0 in | Wt 137.4 lb

## 2019-11-12 DIAGNOSIS — R300 Dysuria: Secondary | ICD-10-CM | POA: Diagnosis not present

## 2019-11-12 DIAGNOSIS — F909 Attention-deficit hyperactivity disorder, unspecified type: Secondary | ICD-10-CM

## 2019-11-12 LAB — POCT URINALYSIS DIPSTICK
Appearance: ABNORMAL
Bilirubin, UA: NEGATIVE
Blood, UA: POSITIVE
Glucose, UA: NEGATIVE
Ketones, UA: NEGATIVE
Nitrite, UA: NEGATIVE
Odor: ABNORMAL
Protein, UA: NEGATIVE
Spec Grav, UA: 1.025 (ref 1.010–1.025)
Urobilinogen, UA: 0.2 E.U./dL
pH, UA: 6 (ref 5.0–8.0)

## 2019-11-12 MED ORDER — AMPHETAMINE-DEXTROAMPHET ER 15 MG PO CP24: 15.0000 mg | ORAL_CAPSULE | ORAL | 0 refills | Status: DC

## 2019-11-12 MED ORDER — NITROFURANTOIN MONOHYD MACRO 100 MG PO CAPS: 100.0000 mg | ORAL_CAPSULE | Freq: Two times a day (BID) | ORAL | 0 refills | Status: DC

## 2019-11-12 NOTE — Progress Notes (Signed)
Name: Melanie Gordon   MRN: [id number]    DOB: Nov 04, 2000   Date:11/12/2019       Progress Note  Subjective  Chief Complaint  Chief Complaint  Patient presents with  . ADHD  . Dysuria    She has a small amount of discomfort after urinating. Seem like the start of a UTI.    HPI  ADD: she is doing well on Adderal XR, she states only feels nauseated if she does not eat with medication. She also only notices lack of appetite the day following a skipped medication day. It helps her focus. No palpitation or weight loss. She usually eats breakfast and lunch  Dysuria: she has one sexual partner, usually has UTI if she does not void after intercourse , she states this episode started a couple of days ago, some supra pubic discomfort no dysuria. No back pain or fever  Patient Active Problem List   Diagnosis Date Noted  . Atopic dermatitis 05/15/2018  . ADHD (attention deficit hyperactivity disorder) 05/08/2018  . Irregular menses 05/08/2018  . Seizure-like activity (HCC) 02/08/2018    History reviewed. No pertinent surgical history.  Family History  Adopted: Yes  Problem Relation Age of Onset  . Stroke Paternal Grandmother     Social History   Tobacco Use  . Smoking status: Never Smoker  . Smokeless tobacco: Never Used  Substance Use Topics  . Alcohol use: No     Current Outpatient Medications:  .  amphetamine-dextroamphetamine (ADDERALL XR) 15 MG 24 hr capsule, Take 1 capsule by mouth every morning., Disp: 30 capsule, Rfl: 0 .  cetirizine (ZYRTEC) 10 MG tablet, Take 1 tablet (10 mg total) by mouth daily., Disp: 30 tablet, Rfl: 11 .  fluticasone (FLONASE) 50 MCG/ACT nasal spray, Place 2 sprays into both nostrils daily., Disp: 16 g, Rfl: 6 .  olopatadine (PATANOL) 0.1 % ophthalmic solution, Place 1 drop into both eyes 2 (two) times daily., Disp: 5 mL, Rfl: 2 .  pantoprazole (PROTONIX) 20 MG tablet, Take 1 tablet (20 mg total) by mouth daily before breakfast., Disp: 30 tablet, Rfl:  1 .  amphetamine-dextroamphetamine (ADDERALL XR) 15 MG 24 hr capsule, Take 1 capsule by mouth every morning., Disp: 30 capsule, Rfl: 0 .  amphetamine-dextroamphetamine (ADDERALL XR) 15 MG 24 hr capsule, Take 1 capsule by mouth every morning., Disp: 30 capsule, Rfl: 0  Allergies  Allergen Reactions  . Pollen Extract Other (See Comments)    I personally reviewed active problem list, medication list, allergies, family history, social history, health maintenance with the patient/caregiver today.   ROS  Constitutional: Negative for fever or weight change.  Respiratory: Negative for cough and shortness of breath.   Cardiovascular: Negative for chest pain or palpitations.  Gastrointestinal: Negative for abdominal pain, no bowel changes.  Musculoskeletal: Negative for gait problem or joint swelling.  Skin: Negative for rash.  Neurological: Negative for dizziness or headache.  No other specific complaints in a complete review of systems (except as listed in HPI above).  Objective  Vitals:   11/12/19 1047  BP: 110/70  Pulse: (!) 109  Resp: 16  Temp: (!) 97.5 F (36.4 C)  TempSrc: Temporal  SpO2: 99%  Weight: 137 lb 6.4 oz (62.3 kg)  Height: 5\' 7"  (1.702 m)    Body mass index is 21.52 kg/m.  Physical Exam  Constitutional: Patient appears well-developed and well-nourished.  No distress.  HEENT: head atraumatic, normocephalic, pupils equal and reactive to light, neck supple Cardiovascular: Normal rate,  regular rhythm and normal heart sounds.  No murmur heard. No BLE edema. Pulmonary/Chest: Effort normal and breath sounds normal. No respiratory distress. Abdominal: Soft.  There is no tenderness. Psychiatric: Patient has a normal mood and affect. behavior is normal. Judgment and thought content normal.  Recent Results (from the past 2160 hour(s))  Urine cytology ancillary only     Status: None   Collection Time: 09/07/19 10:04 AM  Result Value Ref Range   Neisseria Gonorrhea  Negative    Chlamydia Negative    Comment Normal Reference [place] Chlamydia - Negative    Comment      Normal Reference Range Neisseria Gonorrhea - Negative  POCT Urinalysis Dipstick     Status: Abnormal   Collection Time: 11/12/19 11:09 AM  Result Value Ref Range   Color, UA Yellow    Clarity - urine Turbid    Glucose, UA Negative Negative   Bilirubin, UA Negative    Ketones, UA Negative    Spec Grav, UA 1.025 1.010 - 1.025   Blood, UA Positive     Comment: Large   pH, UA 6.0 5.0 - 8.0   Protein, UA Negative Negative   Urobilinogen, UA 0.2 0.2 or 1.0 E.U./dL   Nitrite, UA Negative    Leukocytes, UA Moderate (2+) (A) Negative   Appearance Abnormal    Odor Abnormal      PHQ2/9: Depression screen [facility] 2/9 11/12/2019 09/07/2019 08/06/2019 05/06/2019 04/02/2019  Decreased Interest 0 0 0 0 1  Down, Depressed, Hopeless 0 0 0 0 1  PHQ - 2 Score 0 0 0 0 2  Altered sleeping 0 0 0 0 0  Tired, decreased energy 0 0 0 0 0  Change in appetite 0 0 0 0 0  Feeling bad or failure about yourself  0 0 0 0 0  Trouble concentrating 0 0 0 0 0  Moving slowly or fidgety/restless 0 0 0 0 0  Suicidal thoughts 0 0 0 0 0  PHQ-9 Score 0 0 0 0 2  Difficult doing work/chores - - Not difficult at all Not difficult at all -  Some recent data might be hidden    phq 9 is negative   Fall Risk: Fall Risk  11/12/2019 09/07/2019 08/06/2019 05/06/2019 04/02/2019  Falls in the past year? 0 0 0 0 0  Comment - - - - -  Number falls in past yr: 0 0 0 0 0  Injury with Fall? 0 0 0 0 0  Follow up - - - Falls evaluation completed Falls evaluation completed     Assessment & Plan  1. Dysuria  - POCT Urinalysis Dipstick - Urine Culture  2. Attention deficit hyperactivity disorder (ADHD), unspecified ADHD type  - amphetamine-dextroamphetamine (ADDERALL XR) 15 MG 24 hr capsule; Take 1 capsule by mouth every morning.  Dispense: 30 capsule; Refill: 0 - amphetamine-dextroamphetamine (ADDERALL XR) 15 MG 24 hr capsule;  Take 1 capsule by mouth every morning.  Dispense: 30 capsule; Refill: 0 - amphetamine-dextroamphetamine (ADDERALL XR) 15 MG 24 hr capsule; Take 1 capsule by mouth every morning.  Dispense: 30 capsule; Refill: 0

## 2019-11-14 LAB — URINE CULTURE
MICRO NUMBER:: 10664488
SPECIMEN QUALITY:: ADEQUATE

## 2019-11-22 ENCOUNTER — Encounter: Payer: Self-pay | Admitting: Emergency Medicine

## 2019-11-22 ENCOUNTER — Emergency Department
Admission: EM | Admit: 2019-11-22 | Discharge: 2019-11-22 | Disposition: A | Discharge status: Home / Self Care | Payer: Medicaid Other | Attending: Emergency Medicine | Admitting: Emergency Medicine

## 2019-11-22 ENCOUNTER — Other Ambulatory Visit: Payer: Self-pay

## 2019-11-22 DIAGNOSIS — E860 Dehydration: Secondary | ICD-10-CM | POA: Insufficient documentation

## 2019-11-22 DIAGNOSIS — R55 Syncope and collapse: Secondary | ICD-10-CM | POA: Insufficient documentation

## 2019-11-22 DIAGNOSIS — F909 Attention-deficit hyperactivity disorder, unspecified type: Secondary | ICD-10-CM | POA: Diagnosis not present

## 2019-11-22 DIAGNOSIS — Z79899 Other long term (current) drug therapy: Secondary | ICD-10-CM | POA: Insufficient documentation

## 2019-11-22 LAB — BASIC METABOLIC PANEL
Anion gap: 10 (ref 5–15)
BUN: 13 mg/dL (ref 6–20)
CO2: 21 mmol/L — ABNORMAL LOW (ref 22–32)
Calcium: 9 mg/dL (ref 8.9–10.3)
Chloride: 105 mmol/L (ref 98–111)
Creatinine, Ser: 0.87 mg/dL (ref 0.44–1.00)
GFR calc Af Amer: >60 mL/min (ref >60)
GFR calc non Af Amer: >60 mL/min (ref >60)
Glucose, Bld: 94 mg/dL (ref 70–99)
Potassium: 3.5 mmol/L (ref 3.5–5.1)
Sodium: 136 mmol/L (ref 135–145)

## 2019-11-22 LAB — CBC
HCT: 37.7 % (ref 36.0–46.0)
Hemoglobin: 12.3 g/dL (ref 12.0–15.0)
MCH: 26.7 pg (ref 26.0–34.0)
MCHC: 32.6 g/dL (ref 30.0–36.0)
MCV: 81.8 fL (ref 80.0–100.0)
Platelets: 300 10*3/uL (ref 150–400)
RBC: 4.61 MIL/uL (ref 3.87–5.11)
RDW: 13.7 % (ref 11.5–15.5)
WBC: 10.3 10*3/uL (ref 4.0–10.5)
nRBC: 0 % (ref 0.0–0.2)

## 2019-11-22 LAB — URINALYSIS, COMPLETE (UACMP) WITH MICROSCOPIC
Bacteria, UA: NONE SEEN
Bilirubin Urine: NEGATIVE
Glucose, UA: 50 mg/dL — AB
Hgb urine dipstick: NEGATIVE
Ketones, ur: NEGATIVE mg/dL
Leukocytes,Ua: NEGATIVE
Nitrite: NEGATIVE
Protein, ur: 30 mg/dL — AB
Specific Gravity, Urine: 1.013 (ref 1.005–1.030)
pH: 5 (ref 5.0–8.0)

## 2019-11-22 LAB — PREGNANCY, URINE: Preg Test, Ur: NEGATIVE

## 2019-11-22 MED ORDER — SODIUM CHLORIDE 0.9 % IV BOLUS
500.0000 mL | Freq: Once | INTRAVENOUS | Status: AC
  Administered 2019-11-22: 500 mL via INTRAVENOUS

## 2019-11-22 MED ORDER — IBUPROFEN 800 MG PO TABS
800.0000 mg | ORAL_TABLET | Freq: Once | ORAL | Status: AC
  Administered 2019-11-22: 800 mg via ORAL
  Filled 2019-11-22: qty 1

## 2019-11-22 NOTE — ED Triage Notes (Addendum)
Pt in via [facility] from work where patient had a witnessed syncopal episode.  Per EMS, pt appeared to possibly be having a focal seizure upon arrival with some confusion thereafter.  Pt currently A/Ox4 upon arrival, neurologically intact, vitals WDL, NAD noted at this time.    Pt does report she has not eaten all day, and has been outside for the majority of the day at work.

## 2019-11-22 NOTE — ED Notes (Signed)
Pt states she is unable to provide urine specimen at this time.

## 2019-11-22 NOTE — ED Notes (Signed)
See triage notes; pt states she is feeling fine now except her head hurts 8/10; alert and oriented x4.  Pt is ambulatory to the bathroom with a steady gait; in no apparent distress at this time.  Pt does not remember the details surrounding the syncopal episode.

## 2019-11-22 NOTE — ED Notes (Signed)
Meal tray given. Approved by RN.

## 2019-11-22 NOTE — ED Provider Notes (Signed)
[facility] Emergency Department Provider Note  ____________________________________________  Time seen: Approximately 10:17 PM  I have reviewed the triage vital signs and the nursing notes.   HISTORY  Chief Complaint Loss of Consciousness    HPI Melanie Gordon is a 19 y.o. female with a history of ADHD who comes the ED  due to syncope at work.  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  She started to feel lightheaded and so she went inside the building after which she briefly passed out.  Others were there, were able to assist her she did not hit her head or have other significant trauma.  She awoke shortly thereafter, quickly returning to normal.  There is a report of possible convulsive activity that was very short-lived without any pronounced postictal phase.  No urinary incontinence.  Denies tongue or mouth pain.  Patient also adds that she had not had anything to eat or drink earlier today due to lack of appetite, which she reports happens sometimes for her.  Only complaint currently is a mild frontal headache.  No neck pain.  No vision changes.  No paresthesias or weakness.     Past Medical History:  Diagnosis Date  . ADHD (attention deficit hyperactivity disorder)      Patient Active Problem List   Diagnosis Date Noted  . Atopic dermatitis 05/15/2018  . ADHD (attention deficit hyperactivity disorder) 05/08/2018  . Irregular menses 05/08/2018  . Seizure-like activity (HCC) 02/08/2018     History reviewed. No pertinent surgical history.   Prior to Admission medications   Medication Sig Start Date End Date Taking? Authorizing Provider  amphetamine-dextroamphetamine (ADDERALL XR) 15 MG 24 hr capsule Take 1 capsule by mouth every morning. 11/12/19   Alba Cory, MD  amphetamine-dextroamphetamine (ADDERALL XR) 15 MG 24 hr capsule Take 1 capsule by mouth every morning. 11/12/19   Alba Cory,  MD  amphetamine-dextroamphetamine (ADDERALL XR) 15 MG 24 hr capsule Take 1 capsule by mouth every morning. 11/12/19 12/12/19  Alba Cory, MD  cetirizine (ZYRTEC) 10 MG tablet Take 1 tablet (10 mg total) by mouth daily. 09/07/19   Alba Cory, MD  fluticasone (FLONASE) 50 MCG/ACT nasal spray Place 2 sprays into both nostrils daily. 09/07/19   Alba Cory, MD  nitrofurantoin, macrocrystal-monohydrate, (MACROBID) 100 MG capsule Take 1 capsule (100 mg total) by mouth 2 (two) times daily. 11/12/19   Alba Cory, MD  olopatadine (PATANOL) 0.1 % ophthalmic solution Place 1 drop into both eyes 2 (two) times daily. 09/07/19   Alba Cory, MD  pantoprazole (PROTONIX) 20 MG tablet Take 1 tablet (20 mg total) by mouth daily before breakfast. 08/06/19   [name], PA-C     Allergies Pollen extract   Family History  Adopted: Yes  Problem Relation Age of Onset  . Stroke Paternal Grandmother     Social History Social History   Tobacco Use  . Smoking status: Never Smoker  . Smokeless tobacco: Never Used  Vaping Use  . Vaping Use: Every day  Substance Use Topics  . Alcohol use: No  . Drug use: No    Review of Systems  Constitutional:   No fever or chills.  ENT:   No sore throat. No rhinorrhea. Cardiovascular:   No chest pain or palpitations, positive syncope. Respiratory:   No dyspnea or cough. Gastrointestinal:   Negative for abdominal pain, vomiting and diarrhea.  Musculoskeletal:  Negative for focal pain or swelling All other systems reviewed and are negative except as documented above in ROS and HPI.  ____________________________________________   PHYSICAL EXAM:  VITAL SIGNS: ED Triage Vitals  Enc Vitals Group     BP 11/22/19 1810 134/79     Pulse Rate 11/22/19 1810 (!) 106     Resp 11/22/19 1810 15     Temp 11/22/19 1810 99 F (37.2 C)     Temp Source 11/22/19 1810 Oral     SpO2 11/22/19 1810 99 %     Weight 11/22/19 1812 136 lb (61.7 kg)     Height  11/22/19 1812 5\' 6"  (1.676 m)     Head Circumference --      Peak Flow --      Pain Score 11/22/19 1810 0     Pain Loc --      Pain Edu? --      Excl. in GC? --     Vital signs reviewed, nursing assessments reviewed.   Constitutional:   Alert and oriented. Non-toxic appearance. Eyes:   Conjunctivae are normal. EOMI. PERRL. ENT      Head:   Normocephalic and atraumatic.      Nose: Normal      Mouth/Throat:   Moist mucosa      Neck:   No meningismus. Full ROM.  No midline spinal tenderness. Hematological/Lymphatic/Immunilogical:   No cervical lymphadenopathy. Cardiovascular:   RRR, heart rate 90. Symmetric bilateral radial and DP pulses.  No murmurs. Cap refill less than 2 seconds. Respiratory:   Normal respiratory effort without tachypnea/retractions. Breath sounds are clear and equal bilaterally. No wheezes/rales/rhonchi. Gastrointestinal:   Soft and nontender. Non distended. There is no CVA tenderness.  No rebound, rigidity, or guarding.  Musculoskeletal:   Normal range of motion in all extremities. No joint effusions.  No lower extremity tenderness.  No edema. Neurologic:   Normal speech and language.  Motor grossly intact. No acute focal neurologic deficits are appreciated.  Skin:    Skin is warm, dry and intact. No rash noted.  No petechiae, purpura, or bullae.  ____________________________________________    LABS (pertinent positives/negatives) (all labs ordered are listed, but only abnormal results are displayed) Labs Reviewed  BASIC METABOLIC PANEL - Abnormal; Notable for the following components:      Result Value   CO2 21 (*)    All other components within normal limits  URINALYSIS, COMPLETE (UACMP) WITH MICROSCOPIC - Abnormal; Notable for the following components:   Color, Urine YELLOW (*)    APPearance CLEAR (*)    Glucose, UA 50 (*)    Protein, ur 30 (*)    All other components within normal limits  CBC  PREGNANCY, URINE  POC URINE PREG, ED    ____________________________________________   EKG  Interpreted by me Normal sinus rhythm rate of 98, right axis, normal intervals.  Poor R wave progression.  Normal ST segments and T waves.  No acute ischemic changes.  Unchanged compared to previous EKG November 04, 2017.  ____________________________________________    RADIOLOGY  No results found.  ____________________________________________   PROCEDURES Procedures  ____________________________________________    CLINICAL IMPRESSION / ASSESSMENT AND PLAN / ED COURSE  Medications ordered in the ED: Medications  ibuprofen (ADVIL) tablet 800 mg (800 mg Oral Given 11/22/19 2150)  sodium chloride 0.9 % bolus 500 mL (500 mLs Intravenous New Bag/Given 11/22/19 2150)    Pertinent labs & imaging results that were available during my care of the  patient were reviewed by me and considered in my medical decision making (see chart for details).  Melanie Gordon was evaluated in Emergency Department on 11/22/2019 for the symptoms described in the history of present illness. She was evaluated in the context of the global COVID-19 pandemic, which necessitated consideration that the patient might be at risk for infection with the SARS-CoV-2 virus that causes COVID-19. Institutional protocols and algorithms that pertain to the evaluation of patients at risk for COVID-19 are in a state of rapid change based on information released by regulatory bodies including the CDC and federal and state organizations. These policies and algorithms were followed during the patient's care in the ED.   Patient presents with syncope, highly suspicious for dehydration and heat related illness. Considering the patient's symptoms, medical history, and physical examination today, I have low suspicion for ischemic stroke, intracranial hemorrhage, meningitis, encephalitis, carotid or vertebral dissection, venous sinus thrombosis, MS, intracranial hypertension, ACS PE  dissection myocarditis.  Patient is tolerating oral intake.  She is in good spirits, smiling, ambulatory.  Given IV fluids for further hydration, stable for discharge home.       ____________________________________________   FINAL CLINICAL IMPRESSION(S) / ED DIAGNOSES    Final diagnoses:  Syncope, unspecified syncope type  Dehydration     ED Discharge Orders    None      Portions of this note were generated with dragon dictation software. Dictation errors may occur despite best attempts at proofreading.   Sharman Cheek, MD 11/22/19 2221

## 2019-12-06 ENCOUNTER — Ambulatory Visit: Payer: Medicaid Other | Admitting: Family Medicine

## 2019-12-14 ENCOUNTER — Ambulatory Visit: Payer: Self-pay

## 2019-12-14 NOTE — Telephone Encounter (Signed)
Called and spoke with patient.  She was requesting suggestions for vaginal itching problem that she has appointment on Thursday for at office.  Patient states the itching started Sunday.  She has some minor white discharge. Most bothersome is the itching.  She has no rash or odor. She has no abdominal pain or fever.  She has never had symptoms like this and is unsure what may be causing the symptoms.  Care advice read to patient. Appointment is already scheduled.  She was reminded that if she uses OTC meds for itching to stop 24h before OV.  She verbalized understanding.  Reason for Disposition . [1] Symptoms of a "yeast infection" (i.e., itchy, white discharge, not bad smelling) AND [2] not improved > 3 days following CARE ADVICE  Answer Assessment - Initial Assessment Questions 1. DISCHARGE: "Describe the discharge." (e.g., white, yellow, green, gray, foamy, cottage cheese-like)     white 2. ODOR: "Is there a bad odor?"     no 3. ONSET: "When did the discharge begin?"     Started Sunday 4. RASH: "Is there a rash in that area?" If Yes, ask: "Describe it." (e.g., redness, blisters, sores, bumps)    no 5. ABDOMINAL PAIN: "Are you having any abdominal pain?" If Yes, ask: "Wnhat does it feel like? " (e.g., crampy, dull, intermittent, constant)      no 6. ABDOMINAL PAIN SEVERITY: If present, ask: "How bad is it?"  (e.g., mild, moderate, severe)  - MILD - doesn't interfere with normal activities   - MODERATE - interferes with normal activities or awakens from sleep   - SEVERE - patient doesn't want to move (R/O peritonitis)     NoNe 7. CAUSE: "What do you think is causing the discharge?" "Have you had the same problem before? What happened then?"     Never and do not know 8. OTHER SYMPTOMS: "Do you have any other symptoms?" (e.g., fever, itching, vaginal bleeding, pain with urination, injury to genital area, vaginal foreign body)     itching 9. PREGNANCY: "Is there any chance you are pregnant?"  "When was your last menstrual period?"     No  Protocols used: VAGINAL DISCHARGE-A-AH

## 2019-12-16 ENCOUNTER — Ambulatory Visit: Payer: Medicaid Other | Admitting: Family Medicine

## 2019-12-16 NOTE — Progress Notes (Deleted)
Patient ID: Melanie Gordon, female    DOB: 2001/04/24, 19 y.o.   MRN: [id number]  PCP: Alba Cory, MD  No chief complaint on file.   Subjective:   Melanie Gordon is a 19 y.o. female, presents to clinic with CC of the following:  HPI    Patient Active Problem List   Diagnosis Date Noted  . Atopic dermatitis 05/15/2018  . ADHD (attention deficit hyperactivity disorder) 05/08/2018  . Irregular menses 05/08/2018  . Seizure-like activity (HCC) 02/08/2018      Current Outpatient Medications:  .  amphetamine-dextroamphetamine (ADDERALL XR) 15 MG 24 hr capsule, Take 1 capsule by mouth every morning., Disp: 30 capsule, Rfl: 0 .  amphetamine-dextroamphetamine (ADDERALL XR) 15 MG 24 hr capsule, Take 1 capsule by mouth every morning., Disp: 30 capsule, Rfl: 0 .  amphetamine-dextroamphetamine (ADDERALL XR) 15 MG 24 hr capsule, Take 1 capsule by mouth every morning., Disp: 30 capsule, Rfl: 0 .  cetirizine (ZYRTEC) 10 MG tablet, Take 1 tablet (10 mg total) by mouth daily., Disp: 30 tablet, Rfl: 11 .  fluticasone (FLONASE) 50 MCG/ACT nasal spray, Place 2 sprays into both nostrils daily., Disp: 16 g, Rfl: 6 .  nitrofurantoin, macrocrystal-monohydrate, (MACROBID) 100 MG capsule, Take 1 capsule (100 mg total) by mouth 2 (two) times daily., Disp: 10 capsule, Rfl: 0 .  olopatadine (PATANOL) 0.1 % ophthalmic solution, Place 1 drop into both eyes 2 (two) times daily., Disp: 5 mL, Rfl: 2 .  pantoprazole (PROTONIX) 20 MG tablet, Take 1 tablet (20 mg total) by mouth daily before breakfast., Disp: 30 tablet, Rfl: 1   Allergies  Allergen Reactions  . Pollen Extract Other (See Comments)     Social History   Tobacco Use  . Smoking status: Never Smoker  . Smokeless tobacco: Never Used  Vaping Use  . Vaping Use: Every day  Substance Use Topics  . Alcohol use: No  . Drug use: No      Chart Review Today: ***  Review of Systems     Objective:   There were no vitals filed for this visit.   There is no height or weight on file to calculate BMI.  Physical Exam   Results for orders placed or performed during the hospital encounter of 11/22/19  Basic metabolic panel  Result Value Ref Range   Sodium 136 135 - 145 mmol/L   Potassium 3.5 3.5 - 5.1 mmol/L   Chloride 105 98 - 111 mmol/L   CO2 21 (L) 22 - 32 mmol/L   Glucose, Bld 94 70 - 99 mg/dL   BUN 13 6 - 20 mg/dL   Creatinine, Ser 0.10 0.44 - 1.00 mg/dL   Calcium 9.0 8.9 - 27.2 mg/dL   GFR calc non Af Amer >60 >60 mL/min   GFR calc Af Amer >60 >60 mL/min   Anion gap 10 5 - 15  CBC  Result Value Ref Range   WBC 10.3 4.0 - 10.5 K/uL   RBC 4.61 3.87 - 5.11 MIL/uL   Hemoglobin 12.3 12.0 - 15.0 g/dL   HCT 53.6 36 - 46 %   MCV 81.8 80.0 - 100.0 fL   MCH 26.7 26.0 - 34.0 pg   MCHC 32.6 30.0 - 36.0 g/dL   RDW 64.4 03.4 - 74.2 %   Platelets 300 150 - 400 K/uL   nRBC 0.0 0.0 - 0.2 %  Urinalysis, Complete w Microscopic  Result Value Ref Range   Color, Urine YELLOW (A) YELLOW  APPearance CLEAR (A) CLEAR   Specific Gravity, Urine 1.013 1.005 - 1.030   pH 5.0 5.0 - 8.0   Glucose, UA 50 (A) NEGATIVE mg/dL   Hgb urine dipstick NEGATIVE NEGATIVE   Bilirubin Urine NEGATIVE NEGATIVE   Ketones, ur NEGATIVE NEGATIVE mg/dL   Protein, ur 30 (A) NEGATIVE mg/dL   Nitrite NEGATIVE NEGATIVE   Leukocytes,Ua NEGATIVE NEGATIVE   RBC / HPF 0-5 0 - 5 RBC/hpf   WBC, UA 0-5 0 - 5 WBC/hpf   Bacteria, UA NONE SEEN NONE SEEN   Squamous Epithelial / LPF 6-10 0 - 5   Mucus PRESENT   Pregnancy, urine  Result Value Ref Range   Preg Test, Ur NEGATIVE NEGATIVE       Assessment & Plan:   ***     [name], PA-C 12/16/19 12:59 PM

## 2020-01-12 ENCOUNTER — Other Ambulatory Visit: Payer: Self-pay | Admitting: Family Medicine

## 2020-01-12 DIAGNOSIS — F909 Attention-deficit hyperactivity disorder, unspecified type: Secondary | ICD-10-CM

## 2020-01-12 NOTE — Telephone Encounter (Signed)
Patient has a new pharmacy and needs sent over

## 2020-01-12 NOTE — Telephone Encounter (Signed)
Pt has a new pharmacy and needs her refill for amphetamine-dextroamphetamine (ADDERALL XR) 15 MG 24 hr capsule  Sent to  [id number] - [place], [place] - [address] AT [facility] OF [address] Phone:  [phone]  Fax:  [phone]     Please advise

## 2020-01-13 MED ORDER — AMPHETAMINE-DEXTROAMPHET ER 15 MG PO CP24: 15.0000 mg | ORAL_CAPSULE | ORAL | 0 refills | Status: DC

## 2020-01-13 NOTE — Telephone Encounter (Signed)
Medication have been cancelled at previous pharmacy. Please send prescription to new pharmacy.

## 2020-01-19 ENCOUNTER — Telehealth: Payer: Self-pay | Admitting: Family Medicine

## 2020-01-19 NOTE — Telephone Encounter (Signed)
Just FYI called patient per Dr Carlynn Purl and was to make her an appt for her med refill pt said that said she did not need it that she had it taken care of.

## 2020-01-19 NOTE — Telephone Encounter (Signed)
Pt was prescribed Metronidazole for bacterial infection at [facility] clinic walk in on 8.4.21 and she was advised if it didn't clear up to contact PCP for refills /please advise asap    [id number] - [place], [place] - [address] AT [facility] [address] Phone:  [phone]  Fax:  [phone]

## 2020-02-14 NOTE — Progress Notes (Signed)
Name: Melanie Gordon   MRN: [id number]    DOB: 02/04/2001   Date:02/15/2020       Progress Note  Chief Complaint  Patient presents with  . Follow-up    ADD med refills     Subjective:   Melanie Gordon is a 19 y.o. female, presents to clinic for ADHD f/up and med check  ADHD:  Compliant with meds:  Adderall Xr 15 MG capsules   Pt is taking daily in the morning Benefit from med (ie increase in concentration): Yes   Pt denies CP, rapid HR, dry mouth, weight loss, difficulty sleeping, agitation/mood disturbances.  Reviewed VS today with stimulant meds in adult: BP Readings from Last 3 Encounters:  02/15/20 112/72  11/22/19 133/85  11/12/19 110/70    Pulse Readings from Last 3 Encounters:  02/15/20 98  11/22/19 82  11/12/19 (!) 109    PDMP:  Controlled Substance Database was reviewed today, reviewed and no suspicious findings.  Last Refils done by Dr. Carlynn Purl in august and Sept     Current Outpatient Medications:  .  amphetamine-dextroamphetamine (ADDERALL XR) 15 MG 24 hr capsule, Take 1 capsule by mouth every morning., Disp: 30 capsule, Rfl: 0 .  amphetamine-dextroamphetamine (ADDERALL XR) 15 MG 24 hr capsule, Take 1 capsule by mouth every morning., Disp: 30 capsule, Rfl: 0 .  cetirizine (ZYRTEC) 10 MG tablet, Take 1 tablet (10 mg total) by mouth daily., Disp: 30 tablet, Rfl: 11 .  fluticasone (FLONASE) 50 MCG/ACT nasal spray, Place 2 sprays into both nostrils daily., Disp: 16 g, Rfl: 6 .  amphetamine-dextroamphetamine (ADDERALL XR) 15 MG 24 hr capsule, Take 1 capsule by mouth every morning., Disp: 30 capsule, Rfl: 0 .  nitrofurantoin, macrocrystal-monohydrate, (MACROBID) 100 MG capsule, Take 1 capsule (100 mg total) by mouth 2 (two) times daily. (Patient not taking: Reported on 02/15/2020), Disp: 10 capsule, Rfl: 0 .  olopatadine (PATANOL) 0.1 % ophthalmic solution, Place 1 drop into both eyes 2 (two) times daily. (Patient not taking: Reported on 02/15/2020), Disp: 5 mL, Rfl:  2 .  pantoprazole (PROTONIX) 20 MG tablet, Take 1 tablet (20 mg total) by mouth daily before breakfast. (Patient not taking: Reported on 02/15/2020), Disp: 30 tablet, Rfl: 1  Patient Active Problem List   Diagnosis Date Noted  . Atopic dermatitis 05/15/2018  . ADHD (attention deficit hyperactivity disorder) 05/08/2018  . Irregular menses 05/08/2018  . Seizure-like activity (HCC) 02/08/2018    No past surgical history on file.  Family History  Adopted: Yes  Problem Relation Age of Onset  . Stroke Paternal Grandmother     Social History   Tobacco Use  . Smoking status: Never Smoker  . Smokeless tobacco: Never Used  Vaping Use  . Vaping Use: Every day  Substance Use Topics  . Alcohol use: No  . Drug use: No     Allergies  Allergen Reactions  . Pollen Extract Other (See Comments)    Health Maintenance  Topic Date Due  . COVID-19 Vaccine (1) Never done  . INFLUENZA VACCINE  Never done  . PAP-Cervical Cytology Screening  01/08/2020  . PAP SMEAR-Modifier  01/08/2020  . TETANUS/TDAP  09/06/2029  . Hepatitis C Screening  Completed  . HIV Screening  Completed    Chart Review Today: I personally reviewed active problem list, medication list, allergies, family history, social history, health maintenance, notes from last encounter, lab results, imaging with the patient/caregiver today.   Review of Systems  10 Systems reviewed and  are negative for acute change except as noted in the HPI.  Objective:   Vitals:   02/15/20 1313  BP: 112/72  Pulse: 98  Resp: 16  Temp: 97.7 F (36.5 C)  TempSrc: Oral  SpO2: 99%  Weight: 138 lb 3.2 oz (62.7 kg)  Height: 5\' 7"  (1.702 m)    Body mass index is 21.65 kg/m.  Physical Exam Vitals and nursing note reviewed.  Constitutional:      General: She is not in acute distress.    Appearance: Normal appearance. She is well-developed. She is not ill-appearing, toxic-appearing or diaphoretic.     Interventions: Face mask in place.   HENT:     Head: Normocephalic and atraumatic.     Right Ear: External ear normal.     Left Ear: External ear normal.  Eyes:     General: Lids are normal. No scleral icterus.       Right eye: No discharge.        Left eye: No discharge.     Conjunctiva/sclera: Conjunctivae normal.  Neck:     Trachea: Phonation normal. No tracheal deviation.  Cardiovascular:     Rate and Rhythm: Normal rate and regular rhythm.     Pulses: Normal pulses.          Radial pulses are 2+ on the right side and 2+ on the left side.       Posterior tibial pulses are 2+ on the right side and 2+ on the left side.     Heart sounds: Normal heart sounds. No murmur heard.  No friction rub. No gallop.   Pulmonary:     Effort: Pulmonary effort is normal. No respiratory distress.     Breath sounds: Normal breath sounds. No stridor. No wheezing, rhonchi or rales.  Chest:     Chest wall: No tenderness.  Abdominal:     General: Bowel sounds are normal. There is no distension.     Palpations: Abdomen is soft.  Musculoskeletal:     Right lower leg: No edema.     Left lower leg: No edema.  Skin:    General: Skin is warm and dry.     Coloration: Skin is not jaundiced or pale.     Findings: No rash.  Neurological:     Mental Status: She is alert.     Motor: No abnormal muscle tone.     Gait: Gait normal.  Psychiatric:        Mood and Affect: Mood normal.        Speech: Speech normal.        Behavior: Behavior normal.         Assessment & Plan:     ICD-10-CM   1. Attention deficit hyperactivity disorder (ADHD), unspecified ADHD type  F90.9 amphetamine-dextroamphetamine (ADDERALL XR) 15 MG 24 hr capsule    amphetamine-dextroamphetamine (ADDERALL XR) 15 MG 24 hr capsule    amphetamine-dextroamphetamine (ADDERALL XR) 15 MG 24 hr capsule   med check and refills, working well, no concerning SE, VSS  2. Seasonal allergies  J30.2    continue zyrtec and flonase, pataday drops prn  3. Need for influenza  vaccination  Z23 Flu Vaccine QUAD 6+ mos PF IM (Fluarix Quad PF)     Return in about 3 months (around 05/17/2020) for ADHD med f/up.   07/15/2020, PA-C 02/15/20 1:15 PM

## 2020-02-15 ENCOUNTER — Other Ambulatory Visit: Payer: Self-pay

## 2020-02-15 ENCOUNTER — Ambulatory Visit: Payer: Medicaid Other | Admitting: Family Medicine

## 2020-02-15 ENCOUNTER — Encounter: Payer: Self-pay | Admitting: Family Medicine

## 2020-02-15 VITALS — BP 112/72 | HR 98 | Temp 97.7°F | Resp 16 | Ht 67.0 in | Wt 138.2 lb

## 2020-02-15 DIAGNOSIS — F909 Attention-deficit hyperactivity disorder, unspecified type: Secondary | ICD-10-CM

## 2020-02-15 DIAGNOSIS — J302 Other seasonal allergic rhinitis: Secondary | ICD-10-CM | POA: Diagnosis not present

## 2020-02-15 DIAGNOSIS — Z23 Encounter for immunization: Secondary | ICD-10-CM | POA: Diagnosis not present

## 2020-02-15 MED ORDER — AMPHETAMINE-DEXTROAMPHET ER 15 MG PO CP24: 15.0000 mg | ORAL_CAPSULE | ORAL | 0 refills | Status: DC

## 2020-02-15 NOTE — Patient Instructions (Signed)
Get a separate appt (can be virtual) to discuss birth control options  We always recommend condoms to decrease risk of STDs  Hormonal Contraception Information Hormonal contraception is a type of birth control that uses hormones to prevent pregnancy. It usually involves a combination of the hormones estrogen and progesterone or only the hormone progesterone. Hormonal contraception works in these ways:  It thickens the mucus in the cervix, making it harder for sperm to enter the uterus.  It changes the lining of the uterus, making it harder for an egg to implant.  It may stop the ovaries from releasing eggs (ovulation). Some women who take hormonal contraceptives that contain only progesterone may continue to ovulate. Hormonal contraception cannot prevent sexually transmitted infections (STIs). Pregnancy may still occur. Estrogen and progesterone contraceptives Contraceptives that use a combination of estrogen and progesterone are available in these forms:  Pill. Pills come in different combinations of hormones. They must be taken at the same time each day. Pills can affect your period, causing you to get your period once every three months or not at all.  Patch. The patch must be worn on the lower abdomen for three weeks and then removed on the fourth.  Vaginal ring. The ring is placed in the vagina and left there for three weeks. It is then removed for one week. Progesterone contraceptives Contraceptives that use progesterone only are available in these forms:  Pill. Pills should be taken every day of the cycle.  Intrauterine device (IUD). This device is inserted into the uterus and removed or replaced every five years or sooner.  Implant. Plastic rods are placed under the skin of the upper arm. They are removed or replaced every three years or sooner.  Injection. The injection is given once every 90 days. What are the side effects? The side effects of estrogen and progesterone  contraceptives include:  Nausea.  Headaches.  Breast tenderness.  Bleeding or spotting between menstrual cycles.  High blood pressure (rare).  Strokes, heart attacks, or blood clots (rare) Side effects of progesterone-only contraceptives include:  Nausea.  Headaches.  Breast tenderness.  Unpredictable menstrual bleeding.  High blood pressure (rare). Talk to your health care provider about what side effects may affect you. Where to find more information  Ask your health care provider for more information and resources about hormonal contraception.  [place] Department of Health and [employer]ist on Women's Health: http://hoffman.com/ Questions to ask:  What type of hormonal contraception is right for me?  How long should I plan to use hormonal contraception?  What are the side effects of the hormonal contraception method I choose?  How can I prevent STIs while using hormonal contraception? Contact a health care provider if:  You start taking hormonal contraceptives and you develop persistent or severe side effects. Summary  Estrogen and progesterone are hormones used in many forms of birth control.  Talk to your health care provider about what side effects may affect you.  Hormonal contraception cannot prevent sexually transmitted infections (STIs).  Ask your health care provider for more information and resources about hormonal contraception. This information is not intended to replace advice given to you by your health care provider. Make sure you discuss any questions you have with your health care provider. Document Revised: 08/24/2018 Document Reviewed: 03/29/2016 Elsevier Patient Education  2020 [employer].

## 2020-03-14 ENCOUNTER — Ambulatory Visit: Payer: Medicaid Other | Admitting: Family Medicine

## 2020-05-16 ENCOUNTER — Ambulatory Visit: Payer: Medicaid Other | Admitting: Family Medicine

## 2020-05-22 ENCOUNTER — Ambulatory Visit: Payer: Medicaid Other | Admitting: Family Medicine

## 2020-05-22 NOTE — Progress Notes (Deleted)
   Name: Melanie Gordon   MRN: [id number]    DOB: Jun 09, 2000   Date:05/22/2020       Progress Note  No chief complaint on file.    Subjective:   Melanie Gordon is a 20 y.o. female, presents to clinic for   ADHD:  Compliant with meds: ***  Pt is taking adderall XR 15 mg daily  Benefit from med (ie increase in concentration): {yes/no:20286} Pt denies CP, rapid HR, dry mouth, weight loss, difficulty sleeping, agitation/mood disturbances.  Reviewed VS today with stimulant meds in adult: BP Readings from Last 3 Encounters:  02/15/20 112/72  11/22/19 133/85  11/12/19 110/70    Pulse Readings from Last 3 Encounters:  02/15/20 98  11/22/19 82  11/12/19 (!) 109    PDMP:  Controlled Substance Database was *** today, reviewed and no suspicious findings.  Adult ADHD Self-Report Scale (ASRS-v1.1) Symptom Checklist  Part A    Part B        Current Outpatient Medications:  .  amphetamine-dextroamphetamine (ADDERALL XR) 15 MG 24 hr capsule, Take 1 capsule by mouth every morning., Disp: 30 capsule, Rfl: 0 .  amphetamine-dextroamphetamine (ADDERALL XR) 15 MG 24 hr capsule, Take 1 capsule by mouth every morning., Disp: 30 capsule, Rfl: 0 .  amphetamine-dextroamphetamine (ADDERALL XR) 15 MG 24 hr capsule, Take 1 capsule by mouth every morning., Disp: 30 capsule, Rfl: 0 .  cetirizine (ZYRTEC) 10 MG tablet, Take 1 tablet (10 mg total) by mouth daily., Disp: 30 tablet, Rfl: 11 .  fluticasone (FLONASE) 50 MCG/ACT nasal spray, Place 2 sprays into both nostrils daily., Disp: 16 g, Rfl: 6  Patient Active Problem List   Diagnosis Date Noted  . Atopic dermatitis 05/15/2018  . ADHD (attention deficit hyperactivity disorder) 05/08/2018  . Irregular menses 05/08/2018  . Seizure-like activity (HCC) 02/08/2018    No past surgical history on file.  Family History  Adopted: Yes  Problem Relation Age of Onset  . Stroke Paternal Grandmother     Social History   Tobacco Use  . Smoking status:  Never Smoker  . Smokeless tobacco: Never Used  Vaping Use  . Vaping Use: Every day  Substance Use Topics  . Alcohol use: No  . Drug use: No     Allergies  Allergen Reactions  . Pollen Extract Other (See Comments)    Health Maintenance  Topic Date Due  . COVID-19 Vaccine (1) Never done  . PAP-Cervical Cytology Screening  Never done  . PAP SMEAR-Modifier  Never done  . TETANUS/TDAP  09/06/2029  . INFLUENZA VACCINE  Completed  . Hepatitis C Screening  Completed  . HIV Screening  Completed    Chart Review Today: ***  Review of Systems   Objective:   There were no vitals filed for this visit.  There is no height or weight on file to calculate BMI.  Physical Exam      Assessment & Plan:   ***  No follow-ups on file.   [name], PA-C 05/22/20 1:41 PM

## 2020-05-31 ENCOUNTER — Encounter: Payer: Self-pay | Admitting: Family Medicine

## 2020-05-31 ENCOUNTER — Telehealth (INDEPENDENT_AMBULATORY_CARE_PROVIDER_SITE_OTHER): Payer: Medicaid Other | Admitting: Family Medicine

## 2020-05-31 ENCOUNTER — Telehealth: Payer: Medicaid Other | Admitting: Internal Medicine

## 2020-05-31 VITALS — Ht 66.0 in | Wt 138.0 lb

## 2020-05-31 DIAGNOSIS — F909 Attention-deficit hyperactivity disorder, unspecified type: Secondary | ICD-10-CM

## 2020-05-31 MED ORDER — AMPHETAMINE-DEXTROAMPHET ER 15 MG PO CP24: 15.0000 mg | ORAL_CAPSULE | ORAL | 0 refills | Status: DC

## 2020-05-31 NOTE — Progress Notes (Signed)
Name: Melanie Gordon   MRN: [id number]    DOB: 15-Apr-2001   Date:05/31/2020       Progress Note  Subjective:    Chief Complaint  Chief Complaint  Patient presents with  . Medication Refill  . ADHD    I connected with  Melanie Gordon on 05/31/20 at  2:40 PM EST by telephone and verified that I am speaking with the correct person using two identifiers.  I discussed the limitations, risks, security and privacy concerns of performing an evaluation and management service by telephone and the availability of in person appointments. Staff also discussed with the patient that there may be a patient responsible charge related to this service. Patient Location: Home Provider Location: Home Additional Individuals present: None  HPI  Patient is a 20 year old female patient of Melanie Gordon Last visit with her was 02/15/2020 Follows up today with a phone visit for refills of her ADHD medications She was provided enough for 3 months time at that last visit.  Melanie Gordon is a 20 y.o. female, presents to clinic for ADHD f/up and med check  I sent link to pt for virtual encounter, she was only able to join per telephone/voice and was at the post office out in public.  She said she would be home in 10 min, I have sent additional links to pt to do visit and she has not connected. Sent in one refill of her meds and we'll have to reschedule for in person OV in the next month - no provider assessment done - no visit completed today  ADHD:  Compliant with meds:  Adderall XR 15 MG capsules   Pt is taking daily in the morning Benefit from med (ie increase in concentration): Yes   Pt denies CP, rapid HR, weight loss, difficulty sleeping, agitation/mood disturbances.  Reviewed previous blood pressure checks   BP Readings from Last 3 Encounters:  02/15/20 112/72  11/22/19 133/85  11/12/19 110/70    PDMP:  Controlled Substance Database was reviewed today, reviewed and no suspicious  findings.  Patient Active Problem List   Diagnosis Date Noted  . Atopic dermatitis 05/15/2018  . ADHD (attention deficit hyperactivity disorder) 05/08/2018  . Irregular menses 05/08/2018  . Seizure-like activity (HCC) 02/08/2018    Current Outpatient Medications:  .  cetirizine (ZYRTEC) 10 MG tablet, Take 1 tablet (10 mg total) by mouth daily., Disp: 30 tablet, Rfl: 11 .  fluticasone (FLONASE) 50 MCG/ACT nasal spray, Place 2 sprays into both nostrils daily., Disp: 16 g, Rfl: 6 .  amphetamine-dextroamphetamine (ADDERALL XR) 15 MG 24 hr capsule, Take 1 capsule by mouth every morning., Disp: 30 capsule, Rfl: 0 .  amphetamine-dextroamphetamine (ADDERALL XR) 15 MG 24 hr capsule, Take 1 capsule by mouth every morning., Disp: 30 capsule, Rfl: 0 .  amphetamine-dextroamphetamine (ADDERALL XR) 15 MG 24 hr capsule, Take 1 capsule by mouth every morning., Disp: 30 capsule, Rfl: 0 Allergies  Allergen Reactions  . Pollen Extract Other (See Comments)    History reviewed. No pertinent surgical history. Family History  Adopted: Yes  Problem Relation Age of Onset  . Stroke Paternal Grandmother    Social History   Socioeconomic History  . Marital status: Single    Spouse name: Not on file  . Number of children: 0  . Years of education: Not on file  . Highest education level: Some college, no degree  Occupational History  . Not on file  Tobacco Use  . Smoking status:  Never Smoker  . Smokeless tobacco: Never Used  Vaping Use  . Vaping Use: Every day  Substance and Sexual Activity  . Alcohol use: No  . Drug use: No  . Sexual activity: Not Currently    Partners: Male    Birth control/protection: None  Other Topics Concern  . Not on file  Social History Narrative  . Not on file   Social Determinants of Health   Financial Resource Strain: Not on file  Food Insecurity: Not on file  Transportation Needs: Not on file  Physical Activity: Not on file  Stress: Not on file  Social  Connections: Not on file  Intimate Partner Violence: Not on file    Chart Review Today:  Review of Systems   Objective:    Virtual encounter, vitals limited, only able to obtain the following: Today's Vitals   05/31/20 1452  Weight: 138 lb (62.6 kg)  Height: 5\' 6"  (1.676 m)   Body mass index is 22.27 kg/m. Nursing Note and Vital Signs reviewed.  Physical Exam  PE limited by telephone encounter   PHQ2/9: Depression screen [facility] 2/9 05/31/2020 02/15/2020 11/12/2019 09/07/2019 08/06/2019  Decreased Interest 0 0 0 0 0  Down, Depressed, Hopeless 0 0 0 0 0  PHQ - 2 Score 0 0 0 0 0  Altered sleeping 0 - 0 0 0  Tired, decreased energy 0 - 0 0 0  Change in appetite 0 - 0 0 0  Feeling bad or failure about yourself  0 - 0 0 0  Trouble concentrating 0 - 0 0 0  Moving slowly or fidgety/restless 0 - 0 0 0  Suicidal thoughts 0 - 0 0 0  PHQ-9 Score 0 - 0 0 0  Difficult doing work/chores Not difficult at all - - - Not difficult at all  Some recent data might be hidden   PHQ-2/9 Result is   Fall Risk: Fall Risk  05/31/2020 02/15/2020 11/12/2019 09/07/2019 08/06/2019  Falls in the past year? 0 0 0 0 0  Comment - - - - -  Number falls in past yr: 0 0 0 0 0  Injury with Fall? 0 0 0 0 0  Follow up - Falls evaluation completed - - -     Assessment and Plan:   ADHD - could not successfully do visit today Will reschedule One month refill sent in for pt  I discussed the assessment and treatment plan with the patient. The patient was provided an opportunity to ask questions and all were answered. The patient agreed with the plan and demonstrated an understanding of the instructions.   The patient was advised to call back or seek an in-person evaluation if the symptoms worsen or if the condition fails to improve as anticipated.  I provided 0 minutes of non-face-to-face time during this encounter.  08/08/2019, PA-C 05/31/20 3:42 PM

## 2020-06-26 ENCOUNTER — Ambulatory Visit: Payer: Medicaid Other | Admitting: Family Medicine

## 2020-06-26 DIAGNOSIS — F909 Attention-deficit hyperactivity disorder, unspecified type: Secondary | ICD-10-CM

## 2020-12-22 LAB — HM PAP SMEAR: HM Pap smear: NEGATIVE

## 2023-01-09 ENCOUNTER — Encounter: Payer: Medicaid Other | Admitting: Family Medicine

## 2023-01-09 DIAGNOSIS — Z0000 Encounter for general adult medical examination without abnormal findings: Secondary | ICD-10-CM

## 2023-01-17 NOTE — Patient Instructions (Signed)

## 2023-01-20 ENCOUNTER — Other Ambulatory Visit (HOSPITAL_COMMUNITY)
Admission: RE | Admit: 2023-01-20 | Discharge: 2023-01-20 | Disposition: A | Discharge status: Home / Self Care | Payer: BC Managed Care – PPO | Source: Ambulatory Visit | Attending: Family Medicine | Admitting: Family Medicine

## 2023-01-20 ENCOUNTER — Encounter: Payer: Self-pay | Admitting: Family Medicine

## 2023-01-20 ENCOUNTER — Ambulatory Visit (INDEPENDENT_AMBULATORY_CARE_PROVIDER_SITE_OTHER): Payer: Self-pay | Admitting: Family Medicine

## 2023-01-20 VITALS — BP 94/62 | HR 76 | Temp 97.5°F | Resp 16 | Ht 67.0 in | Wt 128.4 lb

## 2023-01-20 DIAGNOSIS — B009 Herpesviral infection, unspecified: Secondary | ICD-10-CM | POA: Diagnosis not present

## 2023-01-20 DIAGNOSIS — Z113 Encounter for screening for infections with a predominantly sexual mode of transmission: Secondary | ICD-10-CM | POA: Insufficient documentation

## 2023-01-20 DIAGNOSIS — Z0000 Encounter for general adult medical examination without abnormal findings: Secondary | ICD-10-CM

## 2023-01-20 DIAGNOSIS — Z0001 Encounter for general adult medical examination with abnormal findings: Secondary | ICD-10-CM | POA: Diagnosis not present

## 2023-01-20 DIAGNOSIS — Z7689 Persons encountering health services in other specified circumstances: Secondary | ICD-10-CM

## 2023-01-20 DIAGNOSIS — L209 Atopic dermatitis, unspecified: Secondary | ICD-10-CM | POA: Diagnosis not present

## 2023-01-20 MED ORDER — TRIAMCINOLONE ACETONIDE 0.5 % EX OINT: 1.0000 | TOPICAL_OINTMENT | Freq: Two times a day (BID) | CUTANEOUS | 2 refills | Status: AC | PRN

## 2023-01-20 NOTE — Progress Notes (Signed)
Patient: Melanie Gordon, Female    DOB: 2000-06-07, 22 y.o.   MRN: [id number] [name], PA-C Visit Date: 01/20/2023  Today's Provider: [name], PA-C   Chief Complaint  Patient presents with   Annual Exam   Pts last OV in person at [facility] practice was in 2021, she did one virtual visit in 2022, looks like she established at a different primary care for the past 2 years and is here today to reestablish care and do CPE  Recently did virtual visit for acne and eczema, was on OCP with PCP for last 1-2 years, previously on ADHD medications, last Rx was in 2022 that I can see from our last visit, no meds per PDMP for the past 2 years  Subjective:   Annual physical exam:  Melanie Gordon is a 22 y.o. female who presents today for complete physical exam:  Exercise/Activity: exercising several times a week: Sleep: no concerns  SDOH Screenings   Food Insecurity: No Food Insecurity (01/20/2023)  Housing: Low Risk  (01/20/2023)  Transportation Needs: No Transportation Needs (01/20/2023)  Utilities: Not At Risk (01/20/2023)  Alcohol Screen: Low Risk  (01/20/2023)  Depression (PHQ2-9): Low Risk  (01/20/2023)  Financial Resource Strain: Low Risk  (01/20/2023)  Physical Activity: Sufficiently Active (01/20/2023)  Social Connections: Moderately Isolated (01/20/2023)  Stress: No Stress Concern Present (01/20/2023)  Tobacco Use: Low Risk  (01/20/2023)  Health Literacy: Adequate Health Literacy (01/20/2023)     USPSTF grade A and B recommendations - reviewed and addressed today  Depression:  Phq 9 completed today by patient, was reviewed by me with patient in the room PHQ score is negative, pt feels good    01/20/2023    9:19 AM 05/31/2020    2:55 PM 02/15/2020    1:15 PM 11/12/2019   10:51 AM  PHQ 2/9 Scores  PHQ - 2 Score 0 0 0 0  PHQ- 9 Score 0 0  0      01/20/2023    9:19 AM 05/31/2020    2:55 PM 02/15/2020    1:15 PM 11/12/2019   10:51 AM 09/07/2019    9:48 AM  Depression screen PHQ 2/9  Decreased  Interest 0 0 0 0 0  Down, Depressed, Hopeless 0 0 0 0 0  PHQ - 2 Score 0 0 0 0 0  Altered sleeping 0 0  0 0  Tired, decreased energy 0 0  0 0  Change in appetite 0 0  0 0  Feeling bad or failure about yourself  0 0  0 0  Trouble concentrating 0 0  0 0  Moving slowly or fidgety/restless 0 0  0 0  Suicidal thoughts 0 0  0 0  PHQ-9 Score 0 0  0 0  Difficult doing work/chores Not difficult at all Not difficult at all       Alcohol screening: Flowsheet Row Office Visit from 01/20/2023 in [facility]  AUDIT-C Score 2       Immunizations and Health Maintenance: Health Maintenance  Topic Date Due   INFLUENZA VACCINE  12/12/2022   COVID-19 Vaccine (1 - 2023-24 season) 02/05/2023 (Originally 01/12/2023)   PAP-Cervical Cytology Screening  12/23/2023   PAP SMEAR-Modifier  12/23/2023   DTaP/Tdap/Td (8 - Td or Tdap) 09/06/2029   HPV VACCINES  Completed   Hepatitis C Screening  Completed   HIV Screening  Completed    Hep C Screening: done  STD testing and prevention (HIV/chl/gon/syphilis):  see  above, screening today, last partner was earlier this year and maybe once in July   Intimate partner violence: safe   Sexual History/Pain during Intercourse: Single  Menstrual History/LMP/Abnormal Bleeding:  usually regular, this current cycle was late Patient's last menstrual period was 01/16/2023 (exact date).  Incontinence Symptoms: none  Breast cancer: not indicated per age/hx  Cervical cancer screening: UTD due in 2025  Osteoporosis:   Discussion on osteoporosis per age, including high calcium and vitamin D supplementation, weight bearing exercises  Skin cancer:  Hx of skin CA -  NO Discussed atypical lesions  Eczema- poorly controlled, patches hands, sometimes flares on neck, used to be on shots? Referral to reestablish with [facility] - prior pt but last OV more than 3 years ago  Colorectal cancer:  not indicated per age Discussed concerning signs and  sx of CRC, pt denies melena, hematochezia, change in BM pattern or caliber   Lung cancer:   Low Dose CT Chest recommended if Age 49-80 years, 20 pack-year currently smoking OR have quit w/in 15years. Patient does not qualify.    Social History   Tobacco Use   Smoking status: Never   Smokeless tobacco: Never  Vaping Use   Vaping status: Every Day  Substance Use Topics   Alcohol use: Yes    Alcohol/week: 2.0 standard drinks of alcohol    Types: 2 Glasses of wine per week    Comment: occasional   Drug use: No     Flowsheet Row Office Visit from 01/20/2023 in [facility]  AUDIT-C Score 2       Family History  Adopted: Yes  Problem Relation Age of Onset   Stroke Paternal Grandmother      Blood pressure/Hypertension: BP Readings from Last 3 Encounters:  01/20/23 94/62  02/15/20 112/72  11/22/19 133/85    Weight/Obesity: Wt Readings from Last 3 Encounters:  01/20/23 128 lb 6.4 oz (58.2 kg)  05/31/20 138 lb (62.6 kg)  02/15/20 138 lb 3.2 oz (62.7 kg)   BMI Readings from Last 3 Encounters:  01/20/23 20.11 kg/m  05/31/20 22.27 kg/m  02/15/20 21.65 kg/m     Lipids:  No results found for: "CHOL" No results found for: "HDL" No results found for: "LDLCALC" No results found for: "TRIG" No results found for: "CHOLHDL" No results found for: "LDLDIRECT" Based on the results of lipid panel his/her cardiovascular risk factor ( using Poole Cohort )  in the next 10 years is: The ASCVD Risk score (Arnett DK, et al., 2019) failed to calculate for the following reasons:   The 2019 ASCVD risk score is only valid for ages 44 to 31  Glucose:  Glucose, Bld  Date Value Ref Range Status  11/22/2019 94 70 - 99 mg/dL Final    Comment:    Glucose reference range applies only to samples taken after fasting for at least 8 hours.  03/26/2019 87 70 - 99 mg/dL Final  47/42/5956 79 65 - 99 mg/dL Final    Comment:    .            Fasting reference  interval .    Glucose-Capillary  Date Value Ref Range Status  11/04/2017 74 70 - 99 mg/dL Final    Advanced Care Planning:  A voluntary discussion about advance care planning including the explanation and discussion of advance directives.     Social History       Social History   Socioeconomic History   Marital status:  Single    Spouse name: Not on file   Number of children: 0   Years of education: Not on file   Highest education level: Some college, no degree  Occupational History   Not on file  Tobacco Use   Smoking status: Never   Smokeless tobacco: Never  Vaping Use   Vaping status: Every Day  Substance and Sexual Activity   Alcohol use: Yes    Alcohol/week: 2.0 standard drinks of alcohol    Types: 2 Glasses of wine per week    Comment: occasional   Drug use: No   Sexual activity: Not Currently    Partners: Male    Birth control/protection: None  Other Topics Concern   Not on file  Social History Narrative   Not on file   Social Determinants of Health   Financial Resource Strain: Low Risk  (01/20/2023)   Overall Financial Resource Strain (CARDIA)    Difficulty of Paying Living Expenses: Not hard at all  Food Insecurity: No Food Insecurity (01/20/2023)   Hunger Vital Sign    Worried About Running Out of Food in the Last Year: Never true    Ran Out of Food in the Last Year: Never true  Transportation Needs: No Transportation Needs (01/20/2023)   PRAPARE - Administrator, Civil Service (Medical): No    Lack of Transportation (Non-Medical): No  Physical Activity: Sufficiently Active (01/20/2023)   Exercise Vital Sign    Days of Exercise per Week: 3 days    Minutes of Exercise per Session: 60 min  Stress: No Stress Concern Present (01/20/2023)   [employer] of Occupational Health - Occupational Stress Questionnaire    Feeling of Stress : Only a little  Social Connections: Moderately Isolated (01/20/2023)   Social Connection and Isolation Panel  [NHANES]    Frequency of Communication with Friends and Family: Three times a week    Frequency of Social Gatherings with Friends and Family: Three times a week    Attends Religious Services: 1 to 4 times per year    Active Member of Clubs or Organizations: No    Attends Banker Meetings: Never    Marital Status: Never married    Family History        Family History  Adopted: Yes  Problem Relation Age of Onset   Stroke Paternal Grandmother     Patient Active Problem List   Diagnosis Date Noted   Atopic dermatitis 05/15/2018   ADHD (attention deficit hyperactivity disorder) 05/08/2018   Irregular menses 05/08/2018   Seizure-like activity (HCC) 02/08/2018    History reviewed. No pertinent surgical history.   Current Outpatient Medications:    cetirizine (ZYRTEC) 10 MG tablet, Take 1 tablet (10 mg total) by mouth daily., Disp: 30 tablet, Rfl: 11   fluticasone (FLONASE) 50 MCG/ACT nasal spray, Place 2 sprays into both nostrils daily., Disp: 16 g, Rfl: 6   amphetamine-dextroamphetamine (ADDERALL XR) 15 MG 24 hr capsule, Take 1 capsule by mouth every morning., Disp: 30 capsule, Rfl: 0   amphetamine-dextroamphetamine (ADDERALL XR) 15 MG 24 hr capsule, Take 1 capsule by mouth every morning., Disp: 30 capsule, Rfl: 0   amphetamine-dextroamphetamine (ADDERALL XR) 15 MG 24 hr capsule, Take 1 capsule by mouth every morning., Disp: 30 capsule, Rfl: 0  Allergies  Allergen Reactions   Pollen Extract Other (See Comments)    Patient Care Team: [name], PA-C as PCP - General (Family Medicine)   Chart Review: I personally reviewed  active problem list, medication list, allergies, family history, social history, health maintenance, notes from last encounter, lab results, imaging with the patient/caregiver today.   Review of Systems  Constitutional: Negative.   HENT: Negative.    Eyes: Negative.   Respiratory: Negative.    Cardiovascular: Negative.    Gastrointestinal: Negative.   Endocrine: Negative.   Genitourinary: Negative.   Musculoskeletal: Negative.   Skin:  Positive for rash.  Allergic/Immunologic: Negative.   Neurological: Negative.   Hematological: Negative.   Psychiatric/Behavioral: Negative.    All other systems reviewed and are negative.         Objective:   Vitals:  Vitals:   01/20/23 0930  BP: 94/62  Pulse: 76  Resp: 16  Temp: (!) 97.5 F (36.4 C)  TempSrc: Oral  SpO2: 98%  Weight: 128 lb 6.4 oz (58.2 kg)  Height: 5\' 7"  (1.702 m)    Body mass index is 20.11 kg/m.  Physical Exam Vitals and nursing note reviewed.  Constitutional:      General: She is not in acute distress.    Appearance: Normal appearance. She is well-developed and normal weight. She is not ill-appearing, toxic-appearing or diaphoretic.     Interventions: Face mask in place.  HENT:     Head: Normocephalic and atraumatic.     Right Ear: Tympanic membrane, ear canal and external ear normal. There is no impacted cerumen.     Left Ear: Tympanic membrane, ear canal and external ear normal. There is no impacted cerumen.     Nose: Nose normal. No congestion or rhinorrhea.     Mouth/Throat:     Mouth: Mucous membranes are moist.     Pharynx: Oropharynx is clear. No oropharyngeal exudate or posterior oropharyngeal erythema.  Eyes:     General: Lids are normal. No scleral icterus.       Right eye: No discharge.        Left eye: No discharge.     Conjunctiva/sclera: Conjunctivae normal.  Neck:     Trachea: Phonation normal. No tracheal deviation.  Cardiovascular:     Rate and Rhythm: Normal rate and regular rhythm.     Pulses: Normal pulses.          Radial pulses are 2+ on the right side and 2+ on the left side.       Posterior tibial pulses are 2+ on the right side and 2+ on the left side.     Heart sounds: Normal heart sounds. No murmur heard.    No friction rub. No gallop.  Pulmonary:     Effort: Pulmonary effort is normal. No  respiratory distress.     Breath sounds: Normal breath sounds. No stridor. No wheezing, rhonchi or rales.  Chest:     Chest wall: No tenderness.  Abdominal:     General: Bowel sounds are normal. There is no distension.     Palpations: Abdomen is soft.     Tenderness: There is no abdominal tenderness.  Musculoskeletal:     Cervical back: Normal range of motion.     Right lower leg: No edema.     Left lower leg: No edema.  Skin:    General: Skin is warm and dry.     Coloration: Skin is not jaundiced or pale.     Findings: Rash present.  Neurological:     Mental Status: She is alert. Mental status is at baseline.     Motor: No abnormal muscle tone.     Gait:  Gait normal.  Psychiatric:        Mood and Affect: Mood normal.        Speech: Speech normal.        Behavior: Behavior normal.       Fall Risk:    01/20/2023    9:19 AM 05/31/2020    2:55 PM 02/15/2020    1:15 PM 11/12/2019   10:50 AM 09/07/2019    9:48 AM  Fall Risk   Falls in the past year? 0 0 0 0 0  Number falls in past yr: 0 0 0 0 0  Injury with Fall? 0 0 0 0 0  Risk for fall due to : No Fall Risks      Follow up Falls prevention discussed;Education provided;Falls evaluation completed  Falls evaluation completed      Functional Status Survey: Is the patient deaf or have difficulty hearing?: No Does the patient have difficulty seeing, even when wearing glasses/contacts?: No Does the patient have difficulty concentrating, remembering, or making decisions?: No Does the patient have difficulty walking or climbing stairs?: No Does the patient have difficulty dressing or bathing?: No Does the patient have difficulty doing errands alone such as visiting a doctor's office or shopping?: No   Assessment & Plan:    CPE completed today  USPSTF grade A and B recommendations reviewed with patient; age-appropriate recommendations, preventive care, screening tests, etc discussed and encouraged; healthy living encouraged; see  AVS for patient education given to patient  Discussed importance of 150 minutes of physical activity weekly, AHA exercise recommendations given to pt in AVS/handout  Discussed importance of healthy diet:  eating lean meats and proteins, avoiding trans fats and saturated fats, avoid simple sugars and excessive carbs in diet, eat 6 servings of fruit/vegetables daily and drink plenty of water and avoid sweet beverages.    Recommended pt to do annual eye exam and routine dental exams/cleanings  Depression, alcohol, fall screening completed as documented above and per flowsheets  Advance Care planning information and packet discussed and offered today, encouraged pt to discuss with family members/spouse/partner/friends and complete Advanced directive packet and bring copy to office   Reviewed Health Maintenance: Health Maintenance  Topic Date Due   INFLUENZA VACCINE  12/12/2022   COVID-19 Vaccine (1 - 2023-24 season) 02/05/2023 (Originally 01/12/2023)   PAP-Cervical Cytology Screening  12/23/2023   PAP SMEAR-Modifier  12/23/2023   DTaP/Tdap/Td (8 - Td or Tdap) 09/06/2029   HPV VACCINES  Completed   Hepatitis C Screening  Completed   HIV Screening  Completed    Immunizations: Immunization History  Administered Date(s) Administered   Dtap, Unspecified 03/02/1999, 05/10/1999, 07/11/1999, 04/11/2000, 02/09/2003   HIB, Unspecified 03/02/1999, 05/10/1999, 07/11/1999, 04/11/2000   HPV 9-valent 05/19/2014, 11/01/2014   HPV Quadrivalent 02/13/2011   Hep A, Unspecified 02/12/2005, 04/16/2006   Hep B, Unspecified 02/09/1999, 10/08/1999, 03/09/2004   Influenza Nasal 02/13/2011   Influenza,Quad,Nasal, Live 03/29/2013, 05/19/2014   Influenza,inj,Quad PF,6+ Mos 02/15/2020, 05/23/2022   Influenza-Unspecified 03/04/2006, 04/19/2015, 02/15/2020   MMR 01/10/2000, 02/09/2003   Meningococcal Mcv4o 02/13/2011, 06/15/2015   Pneumococcal Conjugate PCV 7 05/10/1999, 07/11/1999, 01/10/2000, 04/11/2000    Polio, Unspecified 03/02/1999, 05/10/1999, 10/08/1999, 02/09/2003   Tdap 06/06/2009, 09/07/2019   Varicella 01/10/2000, 04/16/2006   Vaccines:  HPV: up to at age 64 , ask insurance if age between 32-45  Shingrix: 69-64 yo and ask insurance if covered when patient above 56 yo Pneumonia: not indicated per age/hx Flu: due- offered educated and discussed with patient.  ICD-10-CM   1. Annual physical exam  Z00.00 CBC with Differential/Platelet    COMPLETE METABOLIC PANEL WITH GFR    Lipid panel   due for flu shot    2. Atopic dermatitis, unspecified type  L20.9 Ambulatory referral to Dermatology   she reports poor sx control with last rx from virtual visit with [facility], previously managed by [facility], needs new referral to reestablish    3. Screening for STD (sexually transmitted disease)  Z11.3 Urine cytology ancillary only   screened last Jan 2024, last sexually active July 2024 same partner, screen today, no Sx    4. Herpes simplex  B00.9    reports she got from sexual partner, one outbreak, has antiviral medication          [name], Cordelia Poche 01/20/23 9:34 AM  [facility]

## 2023-01-21 LAB — CBC WITH DIFFERENTIAL/PLATELET
Absolute Monocytes: 426 {cells}/uL (ref 200–950)
Basophils Absolute: 52 {cells}/uL (ref 0–200)
Basophils Relative: 1 %
Eosinophils Absolute: 109 {cells}/uL (ref 15–500)
Eosinophils Relative: 2.1 %
HCT: 36.4 % (ref 35.0–45.0)
Hemoglobin: 11.8 g/dL (ref 11.7–15.5)
Lymphs Abs: 2423 {cells}/uL (ref 850–3900)
MCH: 26.9 pg — ABNORMAL LOW (ref 27.0–33.0)
MCHC: 32.4 g/dL (ref 32.0–36.0)
MCV: 83.1 fL (ref 80.0–100.0)
MPV: 10.3 fL (ref 7.5–12.5)
Monocytes Relative: 8.2 %
Neutro Abs: 2189 {cells}/uL (ref 1500–7800)
Neutrophils Relative %: 42.1 %
Platelets: 236 10*3/uL (ref 140–400)
RBC: 4.38 10*6/uL (ref 3.80–5.10)
RDW: 13.1 % (ref 11.0–15.0)
Total Lymphocyte: 46.6 %
WBC: 5.2 10*3/uL (ref 3.8–10.8)

## 2023-01-21 LAB — COMPLETE METABOLIC PANEL WITH GFR
AG Ratio: 1.9 (calc) (ref 1.0–2.5)
ALT: 12 U/L (ref 6–29)
AST: 13 U/L (ref 10–30)
Albumin: 4.6 g/dL (ref 3.6–5.1)
Alkaline phosphatase (APISO): 41 U/L (ref 31–125)
BUN: 20 mg/dL (ref 7–25)
CO2: 23 mmol/L (ref 20–32)
Calcium: 9.3 mg/dL (ref 8.6–10.2)
Chloride: 109 mmol/L (ref 98–110)
Creat: 0.71 mg/dL (ref 0.50–0.96)
Globulin: 2.4 g/dL (ref 1.9–3.7)
Glucose, Bld: 76 mg/dL (ref 65–99)
Potassium: 4.3 mmol/L (ref 3.5–5.3)
Sodium: 140 mmol/L (ref 135–146)
Total Bilirubin: 0.5 mg/dL (ref 0.2–1.2)
Total Protein: 7 g/dL (ref 6.1–8.1)
eGFR: 122 mL/min/{1.73_m2} (ref >=60)

## 2023-01-21 LAB — URINE CYTOLOGY ANCILLARY ONLY
Chlamydia: NEGATIVE
Comment: NEGATIVE
Comment: NEGATIVE
Comment: NORMAL
Neisseria Gonorrhea: NEGATIVE
Trichomonas: NEGATIVE

## 2023-01-22 ENCOUNTER — Emergency Department
Admission: EM | Admit: 2023-01-22 | Discharge: 2023-01-22 | Disposition: A | Discharge status: Home / Self Care | Payer: BC Managed Care – PPO | Attending: Emergency Medicine | Admitting: Emergency Medicine

## 2023-01-22 ENCOUNTER — Emergency Department: Payer: BC Managed Care – PPO

## 2023-01-22 ENCOUNTER — Encounter: Payer: Self-pay | Admitting: Medical Oncology

## 2023-01-22 DIAGNOSIS — X58XXXA Exposure to other specified factors, initial encounter: Secondary | ICD-10-CM | POA: Insufficient documentation

## 2023-01-22 DIAGNOSIS — T192XXA Foreign body in vulva and vagina, initial encounter: Secondary | ICD-10-CM | POA: Insufficient documentation

## 2023-01-22 LAB — POC URINE PREG, ED: Preg Test, Ur: NEGATIVE

## 2023-01-22 NOTE — ED Triage Notes (Signed)
Pt reports tampon stuck in vagina since 1730 last night. Pt reports unable to find string. Denies pain.

## 2023-01-22 NOTE — ED Provider Notes (Signed)
[facility] Provider Note    Event Date/Time   First MD Initiated Contact with Patient 01/22/23 [phone]     (approximate)   History   Foreign Body   HPI  Melanie Gordon is a 22 y.o. female  with PMH of ADHD, seizure like activity and irregular menses who presents because she was unable to remove her tampon. Patient cannot find the string and states it has been stuck since 1730 last night. She denies pain and discharge.       Physical Exam   Triage Vital Signs: ED Triage Vitals [01/22/23 0736]  Encounter Vitals Group     BP 91/80     Systolic BP Percentile      Diastolic BP Percentile      Pulse Rate 71     Resp 16     Temp 97.7 F (36.5 C)     Temp Source Oral     SpO2 98 %     Weight 127 lb 13.9 oz (58 kg)     Height 5\' 7"  (1.702 m)     Head Circumference      Peak Flow      Pain Score 0     Pain Loc      Pain Education      Exclude from Growth Chart     Most recent vital signs: Vitals:   01/22/23 0736  BP: 91/80  Pulse: 71  Resp: 16  Temp: 97.7 F (36.5 C)  SpO2: 98%      General: Awake, no distress.  CV:  Good peripheral perfusion.  Resp:  Normal effort.  Abd:  No distention.  Other:  Vaginal mucosa moist, physiologic discharge in the vaginal canal, no identifiable foreign body seen on speculum exam or felt on bimanual exam   ED Results / Procedures / Treatments   Labs (all labs ordered are listed, but only abnormal results are displayed) Labs Reviewed  POC URINE PREG, ED     RADIOLOGY  Transvaginal ultrasound obtained to look for retained foreign body, interpreted the images as well as reviewed the radiologist report which is negative   PROCEDURES:  Critical Care performed: No  Procedures   MEDICATIONS ORDERED IN ED: Medications - No data to display   IMPRESSION / MDM / ASSESSMENT AND PLAN / ED COURSE  I reviewed the triage vital signs and the nursing notes.                             22 year old  female presents to the ED because she was unable to remove her tampon.  Vital signs stable in triage patient NAD on exam.  Differential diagnosis includes, but is not limited to, retained foreign body, toxic shock syndrome.  Patient's presentation is most consistent with acute complicated illness / injury requiring diagnostic workup.  I was not able to find the tampon on speculum or bimanual exam.  I will send patient for transvaginal ultrasound to look for a tampon.  If they do not find it, I feel she is safe for discharge and the tampon probably fell out without her knowing.  Patient needed to leave the ED before the official ultrasound read.  I reviewed the images myself and did not see the tampon.  I do not believe she has a retained foreign body.  I gave patient strict return precautions like development of pelvic pain, foul-smelling discharge and fever.  I will  also reach out to patient if the ultrasound images do show a foreign body.  Patient voiced understanding, all questions were answered and she was stable at discharge.     FINAL CLINICAL IMPRESSION(S) / ED DIAGNOSES   Final diagnoses:  Foreign body in vagina, initial encounter     Rx / DC Orders   ED Discharge Orders     None        Note:  This document was prepared using Dragon voice recognition software and may include unintentional dictation errors.   Cameron Ali, PA-C 01/22/23 1001    Sharyn Creamer, MD 01/22/23 1542

## 2023-01-22 NOTE — Discharge Instructions (Signed)
Please return to the ED if you have development of pelvic pain, foul-smelling discharge or fever.

## 2023-04-11 ENCOUNTER — Ambulatory Visit
Admission: RE | Admit: 2023-04-11 | Discharge: 2023-04-11 | Disposition: A | Discharge status: Home / Self Care | Payer: BC Managed Care – PPO | Source: Ambulatory Visit | Attending: Emergency Medicine

## 2023-04-11 VITALS — BP 111/63 | HR 84 | Temp 98.3°F | Resp 17

## 2023-04-11 DIAGNOSIS — K111 Hypertrophy of salivary gland: Secondary | ICD-10-CM | POA: Diagnosis not present

## 2023-04-11 DIAGNOSIS — K1379 Other lesions of oral mucosa: Secondary | ICD-10-CM

## 2023-04-11 DIAGNOSIS — R591 Generalized enlarged lymph nodes: Secondary | ICD-10-CM | POA: Diagnosis not present

## 2023-04-11 LAB — POCT RAPID STREP A (OFFICE): Rapid Strep A Screen: NEGATIVE

## 2023-04-11 MED ORDER — AMOXICILLIN 875 MG PO TABS
875.0000 mg | ORAL_TABLET | Freq: Two times a day (BID) | ORAL | 0 refills | Status: AC
Start: 2023-04-11 — End: 2023-04-18

## 2023-04-11 NOTE — Discharge Instructions (Addendum)
 Take the amoxicillin as directed.  Follow up with your primary care provider on Monday.  Go to the emergency department if you have worsening symptoms.

## 2023-04-11 NOTE — ED Provider Notes (Signed)
Renaldo Fiddler    CSN: [id number] Arrival date & time: 04/11/23  1539      History   Chief Complaint Chief Complaint  Patient presents with   Facial Pain    Unsure of what's wrong. Inside of mouth started hurting a couple of days ago. I felt my face & it feels like a tiny ball inside my cheek... left side only. The pain has gotten worse. - Entered by patient   Oral Pain    HPI Melanie Gordon is a 22 y.o. female.  Accompanied by her mother, patient presents with 2-day history of pain in her left lower jaw on the inside of her mouth.  She has a small knot in the area.  The knot itself is not tender on the outside but is tender on the inside of her mouth.  She denies fever, sore throat, difficulty swallowing, shortness of breath, or other symptoms.  No OTC medications today.  The history is provided by the patient and medical records.    Past Medical History:  Diagnosis Date   ADHD (attention deficit hyperactivity disorder)     Patient Active Problem List   Diagnosis Date Noted   Herpes simplex 01/20/2023   Atopic dermatitis 05/15/2018   ADHD (attention deficit hyperactivity disorder) 05/08/2018   Irregular menses 05/08/2018   Seizure-like activity (HCC) 02/08/2018    History reviewed. No pertinent surgical history.  OB History   No obstetric history on file.      Home Medications    Prior to Admission medications   Medication Sig Start Date End Date Taking? Authorizing Provider  amoxicillin (AMOXIL) 875 MG tablet Take 1 tablet (875 mg total) by mouth 2 (two) times daily for 7 days. 04/11/23 04/18/23 Yes Mickie Bail, NP  triamcinolone ointment (KENALOG) 0.5 % Apply 1 Application topically 2 (two) times daily as needed (eczema rash area). 01/20/23   [name], PA-C    Family History Family History  Adopted: Yes  Problem Relation Age of Onset   Stroke Paternal Grandmother     Social History Social History   Tobacco Use   Smoking status: Never    Smokeless tobacco: Never  Vaping Use   Vaping status: Every Day  Substance Use Topics   Alcohol use: Yes    Alcohol/week: 2.0 standard drinks of alcohol    Types: 2 Glasses of wine per week    Comment: occasional   Drug use: No     Allergies   Pollen extract   Review of Systems Review of Systems  Constitutional:  Negative for chills and fever.  HENT:  Negative for ear pain, sore throat and trouble swallowing.        Mouth pain  Respiratory:  Negative for cough and shortness of breath.      Physical Exam Triage Vital Signs ED Triage Vitals  Encounter Vitals Group     BP 04/11/23 1603 111/63     Systolic BP Percentile --      Diastolic BP Percentile --      Pulse Rate 04/11/23 1603 84     Resp 04/11/23 1603 17     Temp 04/11/23 1603 98.3 F (36.8 C)     Temp Source 04/11/23 1603 Oral     SpO2 04/11/23 1603 100 %     Weight --      Height --      Head Circumference --      Peak Flow --  Pain Score 04/11/23 1602 7     Pain Loc --      Pain Education --      Exclude from Growth Chart --    No data found.  Updated Vital Signs BP 111/63   Pulse 84   Temp 98.3 F (36.8 C) (Oral)   Resp 17   LMP 04/10/2023 (Exact Date)   SpO2 100%   Visual Acuity Right Eye Distance:   Left Eye Distance:   Bilateral Distance:    Right Eye Near:   Left Eye Near:    Bilateral Near:     Physical Exam Constitutional:      General: She is not in acute distress. HENT:     Head:      Comments: Enlarged left submandibular lymph node.     Right Ear: Tympanic membrane normal.     Left Ear: Tympanic membrane normal.     Nose: Nose normal.     Mouth/Throat:     Mouth: Mucous membranes are moist.     Pharynx: Posterior oropharyngeal erythema present.     Comments: Teeth in good repair.  Left buccal mucosa mildly erythematous. Cardiovascular:     Rate and Rhythm: Normal rate and regular rhythm.     Heart sounds: Normal heart sounds.  Pulmonary:     Effort: Pulmonary  effort is normal. No respiratory distress.     Breath sounds: Normal breath sounds.  Neurological:     Mental Status: She is alert.      UC Treatments / Results  Labs (all labs ordered are listed, but only abnormal results are displayed) Labs Reviewed  POCT RAPID STREP A (OFFICE)    EKG   Radiology No results found.  Procedures Procedures (including critical care time)  Medications Ordered in UC Medications - No data to display  Initial Impression / Assessment and Plan / UC Course  I have reviewed the triage vital signs and the nursing notes.  Pertinent labs & imaging results that were available during my care of the patient were reviewed by me and considered in my medical decision making (see chart for details).    Enlarged salivary gland, lymphadenopathy, mouth pain.  Afebrile and vital signs are stable.  Treating with 7-day course of amoxicillin.  Discussed sour foods for stimulation of saliva production.  ED precautions given.  Instructed patient to follow-up with her PCP on Monday.  Education provided on salivary stones and lymphadenopathy.  Final Clinical Impressions(s) / UC Diagnoses   Final diagnoses:  Enlarged salivary gland  Lymphadenopathy  Mouth pain     Discharge Instructions      Take the amoxicillin as directed.  Follow up with your primary care provider on Monday.  Go to the emergency department if you have worsening symptoms.        ED Prescriptions     Medication Sig Dispense Auth. Provider   amoxicillin (AMOXIL) 875 MG tablet Take 1 tablet (875 mg total) by mouth 2 (two) times daily for 7 days. 14 tablet Mickie Bail, NP      PDMP not reviewed this encounter.   Mickie Bail, NP 04/11/23 [phone]

## 2023-04-11 NOTE — ED Triage Notes (Signed)
Patient states that 2 day ago her left side of her jaw was sore. Patient states that she felt her face and it feels like she has a ball. The "ball" its self doesn't hurt just the inside of her mouth hurts.

## 2024-02-03 ENCOUNTER — Ambulatory Visit: Payer: Medicaid Other | Admitting: Dermatology

## 2024-03-03 ENCOUNTER — Ambulatory Visit

## 2024-03-24 ENCOUNTER — Ambulatory Visit

## 2024-04-07 ENCOUNTER — Ambulatory Visit

## 2024-04-14 ENCOUNTER — Ambulatory Visit

## 2024-05-19 ENCOUNTER — Ambulatory Visit

## 2024-06-09 ENCOUNTER — Ambulatory Visit
